# Patient Record
Sex: Male | Born: 1965 | Race: Black or African American | Hispanic: No | Marital: Married | Smoking: Never smoker
Health system: Southern US, Community
[De-identification: ages and names within clinical notes are randomized; demographics above are authoritative.]

## PROBLEM LIST (undated history)

## (undated) DIAGNOSIS — I8500 Esophageal varices without bleeding: Secondary | ICD-10-CM

## (undated) DIAGNOSIS — K7460 Unspecified cirrhosis of liver: Secondary | ICD-10-CM

## (undated) DIAGNOSIS — K759 Inflammatory liver disease, unspecified: Secondary | ICD-10-CM

## (undated) DIAGNOSIS — K766 Portal hypertension: Secondary | ICD-10-CM

## (undated) HISTORY — DX: Inflammatory liver disease, unspecified: K75.9

## (undated) HISTORY — PX: HERNIA REPAIR: SHX51

## (undated) HISTORY — PX: TIPS PROCEDURE: SHX808

## (undated) HISTORY — DX: Portal hypertension: K76.6

## (undated) HISTORY — DX: Unspecified cirrhosis of liver: K74.60

## (undated) HISTORY — DX: Esophageal varices without bleeding: I85.00

---

## 2008-06-09 ENCOUNTER — Ambulatory Visit: Payer: Self-pay | Admitting: Gastroenterology

## 2008-06-09 DIAGNOSIS — B181 Chronic viral hepatitis B without delta-agent: Secondary | ICD-10-CM | POA: Insufficient documentation

## 2008-06-09 DIAGNOSIS — K7460 Unspecified cirrhosis of liver: Secondary | ICD-10-CM | POA: Insufficient documentation

## 2008-06-09 LAB — CONVERTED CEMR LAB
ALT: 42 units/L (ref 0–53)
Albumin: 3.6 g/dL (ref 3.5–5.2)
Alkaline Phosphatase: 78 units/L (ref 39–117)
CO2: 28 meq/L (ref 19–32)
Calcium: 9.2 mg/dL (ref 8.4–10.5)
Creatinine, Ser: 1.1 mg/dL (ref 0.4–1.5)
GFR calc Af Amer: 95 mL/min
Glucose, Bld: 138 mg/dL — ABNORMAL HIGH (ref 70–99)
HCT: 40.9 % (ref 39.0–52.0)
Hemoglobin: 13.3 g/dL (ref 13.0–17.0)
Lymphocytes Relative: 17.9 % (ref 12.0–46.0)
Neutrophils Relative %: 66.5 % (ref 43.0–77.0)
Platelets: 54 10*3/uL — ABNORMAL LOW (ref 150–400)
Prothrombin Time: 15.4 s — ABNORMAL HIGH (ref 10.9–13.3)
RBC: 5.52 M/uL (ref 4.22–5.81)
RDW: 15.9 % — ABNORMAL HIGH (ref 11.5–14.6)
WBC: 2 10*3/uL — ABNORMAL LOW (ref 4.5–10.5)
aPTT: 35.8 s — ABNORMAL HIGH (ref 21.7–29.8)

## 2008-06-15 ENCOUNTER — Ambulatory Visit: Payer: Self-pay | Admitting: Gastroenterology

## 2008-06-15 ENCOUNTER — Ambulatory Visit (HOSPITAL_COMMUNITY): Admission: RE | Admit: 2008-06-15 | Discharge: 2008-06-15 | Payer: Self-pay | Admitting: Gastroenterology

## 2008-06-16 ENCOUNTER — Ambulatory Visit (HOSPITAL_COMMUNITY): Admission: RE | Admit: 2008-06-16 | Discharge: 2008-06-16 | Payer: Self-pay | Admitting: Gastroenterology

## 2009-04-11 ENCOUNTER — Encounter (INDEPENDENT_AMBULATORY_CARE_PROVIDER_SITE_OTHER): Payer: Self-pay | Admitting: *Deleted

## 2009-05-08 ENCOUNTER — Encounter (INDEPENDENT_AMBULATORY_CARE_PROVIDER_SITE_OTHER): Payer: Self-pay | Admitting: *Deleted

## 2010-03-07 ENCOUNTER — Telehealth: Payer: Self-pay | Admitting: Gastroenterology

## 2010-07-07 ENCOUNTER — Encounter: Payer: Self-pay | Admitting: Gastroenterology

## 2010-07-09 ENCOUNTER — Encounter (INDEPENDENT_AMBULATORY_CARE_PROVIDER_SITE_OTHER): Payer: Self-pay | Admitting: *Deleted

## 2010-07-09 ENCOUNTER — Telehealth: Payer: Self-pay | Admitting: Gastroenterology

## 2010-07-16 ENCOUNTER — Encounter: Payer: Self-pay | Admitting: Gastroenterology

## 2010-07-31 ENCOUNTER — Encounter: Payer: Self-pay | Admitting: Gastroenterology

## 2010-08-13 ENCOUNTER — Ambulatory Visit: Payer: Self-pay | Admitting: Gastroenterology

## 2010-08-14 ENCOUNTER — Encounter: Payer: Self-pay | Admitting: Gastroenterology

## 2010-08-14 ENCOUNTER — Telehealth (INDEPENDENT_AMBULATORY_CARE_PROVIDER_SITE_OTHER): Payer: Self-pay | Admitting: *Deleted

## 2010-08-14 LAB — CONVERTED CEMR LAB
ALT: 26 units/L (ref 0–53)
AST: 44 units/L — ABNORMAL HIGH (ref 0–37)
Albumin: 3.7 g/dL (ref 3.5–5.2)
BUN: 15 mg/dL (ref 6–23)
Basophils Absolute: 0 10*3/uL (ref 0.0–0.1)
Basophils Relative: 0.1 % (ref 0.0–3.0)
Calcium: 9.1 mg/dL (ref 8.4–10.5)
Chloride: 106 meq/L (ref 96–112)
GFR calc non Af Amer: 83.89 mL/min (ref >60.00)
Hemoglobin: 12.1 g/dL — ABNORMAL LOW (ref 13.0–17.0)
INR: 1.4 — ABNORMAL HIGH (ref 0.8–1.0)
Lymphs Abs: 0.4 10*3/uL — ABNORMAL LOW (ref 0.7–4.0)
MCHC: 33.2 g/dL (ref 30.0–36.0)
Monocytes Relative: 11.4 % (ref 3.0–12.0)
Neutrophils Relative %: 66.8 % (ref 43.0–77.0)
Prothrombin Time: 14.7 s — ABNORMAL HIGH (ref 9.7–11.8)
RDW: 16.6 % — ABNORMAL HIGH (ref 11.5–14.6)

## 2010-08-26 ENCOUNTER — Encounter: Payer: Self-pay | Admitting: Gastroenterology

## 2010-08-28 ENCOUNTER — Encounter
Admission: RE | Admit: 2010-08-28 | Discharge: 2010-08-28 | Payer: Self-pay | Source: Home / Self Care | Attending: Gastroenterology | Admitting: Gastroenterology

## 2010-08-28 ENCOUNTER — Encounter: Payer: Self-pay | Admitting: Gastroenterology

## 2010-08-28 ENCOUNTER — Ambulatory Visit: Admit: 2010-08-28 | Payer: Self-pay | Admitting: Gastroenterology

## 2010-08-29 ENCOUNTER — Telehealth: Payer: Self-pay | Admitting: Gastroenterology

## 2010-08-29 ENCOUNTER — Encounter: Payer: Self-pay | Admitting: Gastroenterology

## 2010-08-30 ENCOUNTER — Encounter: Payer: Self-pay | Admitting: Gastroenterology

## 2010-08-30 ENCOUNTER — Encounter
Admission: RE | Admit: 2010-08-30 | Discharge: 2010-08-30 | Payer: Self-pay | Source: Home / Self Care | Attending: Gastroenterology | Admitting: Gastroenterology

## 2010-08-30 DIAGNOSIS — K766 Portal hypertension: Secondary | ICD-10-CM | POA: Insufficient documentation

## 2010-09-12 ENCOUNTER — Ambulatory Visit (HOSPITAL_COMMUNITY)
Admission: RE | Admit: 2010-09-12 | Discharge: 2010-09-13 | Payer: Self-pay | Source: Home / Self Care | Attending: Interventional Radiology | Admitting: Interventional Radiology

## 2010-09-16 ENCOUNTER — Telehealth: Payer: Self-pay | Admitting: Gastroenterology

## 2010-09-16 LAB — CBC
Hemoglobin: 12.4 g/dL — ABNORMAL LOW (ref 13.0–17.0)
MCH: 23.9 pg — ABNORMAL LOW (ref 26.0–34.0)
MCV: 74 fL — ABNORMAL LOW (ref 78.0–100.0)
MCV: 72.3 fL — ABNORMAL LOW (ref 78.0–100.0)
Platelets: 61 10*3/uL — ABNORMAL LOW (ref 150–400)
RDW: 15.1 % (ref 11.5–15.5)
WBC: 6.3 10*3/uL (ref 4.0–10.5)

## 2010-09-16 LAB — AMMONIA: Ammonia: 66 umol/L — ABNORMAL HIGH (ref 11–35)

## 2010-09-16 LAB — TYPE AND SCREEN: Antibody Screen: NEGATIVE

## 2010-09-16 LAB — COMPREHENSIVE METABOLIC PANEL
AST: 333 U/L — ABNORMAL HIGH (ref 0–37)
Albumin: 3.3 g/dL — ABNORMAL LOW (ref 3.5–5.2)
Alkaline Phosphatase: 61 U/L (ref 39–117)
Alkaline Phosphatase: 55 U/L (ref 39–117)
BUN: 6 mg/dL (ref 6–23)
CO2: 23 mEq/L (ref 19–32)
Calcium: 8.9 mg/dL (ref 8.4–10.5)
Chloride: 102 mEq/L (ref 96–112)
Creatinine, Ser: 1.2 mg/dL (ref 0.4–1.5)
Creatinine, Ser: 1.09 mg/dL (ref 0.4–1.5)
GFR calc Af Amer: >60 mL/min (ref >60)
GFR calc non Af Amer: >60 mL/min (ref >60)
Glucose, Bld: 91 mg/dL (ref 70–99)
Glucose, Bld: 138 mg/dL — ABNORMAL HIGH (ref 70–99)
Potassium: 4.2 mEq/L (ref 3.5–5.1)
Potassium: 3.5 mEq/L (ref 3.5–5.1)
Total Protein: 6.8 g/dL (ref 6.0–8.3)
Total Protein: 6.2 g/dL (ref 6.0–8.3)

## 2010-09-16 LAB — GLUCOSE, CAPILLARY: Glucose-Capillary: 114 mg/dL — ABNORMAL HIGH (ref 70–99)

## 2010-09-16 LAB — PREPARE PLATELETS: Unit division: 0

## 2010-09-16 LAB — SURGICAL PCR SCREEN: MRSA, PCR: NEGATIVE

## 2010-09-16 LAB — PROTIME-INR: Prothrombin Time: 17.7 seconds — ABNORMAL HIGH (ref 11.6–15.2)

## 2010-09-19 ENCOUNTER — Encounter: Payer: Self-pay | Admitting: Gastroenterology

## 2010-09-20 ENCOUNTER — Ambulatory Visit
Admission: RE | Admit: 2010-09-20 | Discharge: 2010-09-20 | Payer: Self-pay | Source: Home / Self Care | Attending: Gastroenterology | Admitting: Gastroenterology

## 2010-09-20 ENCOUNTER — Other Ambulatory Visit: Payer: Self-pay | Admitting: Gastroenterology

## 2010-09-20 LAB — COMPREHENSIVE METABOLIC PANEL
Albumin: 3.4 g/dL — ABNORMAL LOW (ref 3.5–5.2)
Alkaline Phosphatase: 76 U/L (ref 39–117)
CO2: 28 mEq/L (ref 19–32)
Chloride: 106 mEq/L (ref 96–112)
Creatinine, Ser: 0.8 mg/dL (ref 0.4–1.5)
GFR: 133.25 mL/min (ref >60.00)
Total Protein: 6.4 g/dL (ref 6.0–8.3)

## 2010-09-20 LAB — CBC WITH DIFFERENTIAL/PLATELET
Basophils Absolute: 0 10*3/uL (ref 0.0–0.1)
Eosinophils Absolute: 0.3 10*3/uL (ref 0.0–0.7)
Eosinophils Relative: 8.7 % — ABNORMAL HIGH (ref 0.0–5.0)
Monocytes Absolute: 0.4 10*3/uL (ref 0.1–1.0)
Neutrophils Relative %: 61.6 % (ref 43.0–77.0)
RDW: 16.1 % — ABNORMAL HIGH (ref 11.5–14.6)
WBC: 3.2 10*3/uL — ABNORMAL LOW (ref 4.5–10.5)

## 2010-09-24 ENCOUNTER — Ambulatory Visit
Admission: RE | Admit: 2010-09-24 | Discharge: 2010-09-24 | Payer: Self-pay | Source: Home / Self Care | Attending: Gastroenterology | Admitting: Gastroenterology

## 2010-09-24 ENCOUNTER — Encounter
Admission: RE | Admit: 2010-09-24 | Discharge: 2010-09-24 | Payer: Self-pay | Source: Home / Self Care | Attending: Interventional Radiology | Admitting: Interventional Radiology

## 2010-09-24 NOTE — Letter (Signed)
Summary: New Patient letter  [facility]  [address] [place], [place] [address]   Phone: [phone]  Fax: [phone]       07/09/2010 MRN: [id number]  Gordon Huerta [facility] [address] [place], [place]  [address]  Dear Mr. Gordon Huerta,  Welcome to the Gastroenterology Division at [facility].    You are scheduled to see Dr. Christella Hartigan  on 08/13/10  at 2:45 pm on the 3rd floor at [facility], [address]  We ask that you try to arrive at our office 15 minutes prior to your appointment time to allow for check-in.  We would like you to complete the enclosed self-administered evaluation form prior to your visit and bring it with you on the day of your appointment.  We will review it with you.  Also, please bring a complete list of all your medications or, if you prefer, bring the medication bottles and we will list them.  Please bring your insurance card so that we may make a copy of it.  If your insurance requires a referral to see a specialist, please bring your referral form from your primary care physician.  Co-payments are due at the time of your visit and may be paid by cash, check or credit card.     Your office visit will consist of a consult with your physician (includes a physical exam), any laboratory testing he/she may order, scheduling of any necessary diagnostic testing (e.g. x-ray, ultrasound, CT-scan), and scheduling of a procedure (e.g. Endoscopy, Colonoscopy) if required.  Please allow enough time on your schedule to allow for any/all of these possibilities.    If you cannot keep your appointment, please call [phone] to cancel or reschedule prior to your appointment date.  This allows [place] the opportunity to schedule an appointment for another patient in need of care.  If you do not cancel or reschedule by 5 p.m. the business day prior to your appointment date, you will be charged a $50.00 late cancellation/no-show fee.    Thank you for  choosing [facility] for your medical needs.  We appreciate the opportunity to care for you.  Please visit [place] at our website  to learn more about our practice.                     Sincerely,                                                             The Gastroenterology Division

## 2010-09-24 NOTE — Progress Notes (Signed)
Summary: update  Phone Note Call from Patient Call back at Home Phone [phone]   Caller: mother-in-law, Bonita Quin Call For: Dr. Christella Hartigan Reason for Call: Talk to Nurse Summary of Call: would like to update Dr. Christella Hartigan on pt's current "condition" overseas Initial call taken by: Vallarie Mare,  July 09, 2010 2:17 PM  Follow-up for Phone Call        Pt mother n law spoke with the pt and  he wanted Dr Christella Hartigan to know  he had an esophageal bleed on 07/05/10.  He was sent to his local hospital and had EGD and COLON in [place].  Had three bandings.  The Dr told him he had several places that looked like it could begin to bleed.  The pt is staying in [place] until the pt has improved and then to [place] in December. Wants an appt.  when he returns.  Appt given for 08/13/10  Follow-up by: Chales Abrahams CMA Duncan Dull),  July 09, 2010 3:32 PM

## 2010-09-24 NOTE — Progress Notes (Signed)
Summary: Schedule REV  ---- Converted from flag ---- ---- 03/07/2010 11:20 AM, Rachael Fee MD wrote: I don't know.  She should probably have blood tests for Hep B surface antibody, if + then I don't think she needs any boosters.  ---- 03/07/2010 11:16 AM, Harlow Mares CMA (AAMA) wrote: thanks you the wife has a question that she is hoping you can help her with' she has had the Hep B vaccine and want to know if she would need a booster since her husband has Hep B. They will be back in the [place] next June and she has made a note to call us back about making an appt to see you.   ---- 03/07/2010 10:52 AM, Rachael Fee MD wrote: I think it is a bit late to see him tomorrow...any tests, procedures that he needs done will have to wait since he will be gone on Monday.  He needs to get back in to see me when he is back to [place], preferably a few weeks (at least) before he leaves again for [place]  ---- 03/07/2010 10:45 AM, Harlow Mares CMA (AAMA) wrote: hey patient lives out of the country and is leaving monday to go back to [place] and will not be back until next year. I called him as part of the recall project to see if he could schedule an office visit and he states that he can only come for an office visit today or tomorrow would be best if at all possible. You are in the office tomorrow afternoon, but your full. would you like to add him anyway?   Hulan Saas ------------------------------  Phone Note Outgoing Call   Call placed by: Harlow Mares CMA Duncan Dull),  March 07, 2010 11:24 AM Call placed to: Patient Summary of Call: Called and spoke to the patients wife Jae Dire and she states that they will call back to schedule and office visit when they come back in the [place] next June or sooner if they come back sooner. She will make sure he schedules soon enough to get all of his test done if needed. She thanked Dr. Christella Hartigan for all of his help.  Initial call taken by: Harlow Mares CMA (AAMA),  March 07, 2010  11:26 AM

## 2010-09-26 NOTE — Assessment & Plan Note (Signed)
Review of gastrointestinal problems: 1. 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 antibody was positive E. antigen was negative hepatitis B surface antibody negative hepatitis B surface antigen was positive)  Last imaging 2009 ultrasound suggested cirrhosis without discrete lesions Last alpha-fetoprotein was normal 2009 History of variceal bleeding, 2011 in [place]. Also bled in 2008 in [place] EGD by Dr. Christella Hartigan 2009, portal gastropathy and esophageal varices noted, these were banded. 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  Had EGD (varices in esophagus and in stomach) banding, and glue to gastric varices.  he had a repeat endoscopy about a week later and it showed persistent gastric varices. He brought in a picture that was in color and clearly he does have at least one or 2 trunks of proximal gastric varices.   He is not getting any Hep B treatment, probably aquired in youth.               Current Medications (verified): 1)  Propranolol Hcl 10 Mg Tabs (Propranolol Hcl) .Marland Kitchen.. 1 By Mouth Once Daily 2)  Aldactone 25 Mg Tabs (Spironolactone) .Marland Kitchen.. 1 By Mouth Once Daily 3)  Nexium 20 Mg Cpdr (Esomeprazole Magnesium) .Marland Kitchen.. 1 By Mouth Once Daily  Allergies (verified): No Known Drug Allergies  Vital Signs:  Patient profile:   45 year old male Height:      75 inches Weight:      255 pounds BMI:     31.99 BSA:     2.44 Pulse rate:   60 / minute BP sitting:   110 / 80  (left arm)  Vitals Entered By: Merri Ray CMA Duncan Dull) (August 13, 2010 2:38 PM)  Physical Exam  Additional  Exam:  Constitutional: generally well appearing Psychiatric: alert and oriented times 3 Abdomen: soft, non-tender, non-distended, normal bowel sounds    Impression & Recommendations:  Problem # 1:  cirrhosis, known gastric and esophageal varices visit difficult situation because 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  He has esophageal and gastric varices. This is despite propranolol at dose that is high enough to cause him to have heart rate 60. Don't think there is room to go up on his beta blocker. I explained to him that given his young age, he will very likely need a liver transplant at some point in the next several years.  One of these days he is going to have a major variceal bleed and may not recover from that. I think TIPS may be his best option and I discussed this with interventional radiologist. They agree and are willing to meet him in consultation to consider TIPS.  I explained to he and his wife that over the long haul, he needs to consider relocating an area where he has access to liver transplantation.  Other Orders: T-Hepatitis B Surface Antigen [phone]) T-Hepatitis B Surface Antibody ([id number]) TLB-CMP (Comprehensive Metabolic Pnl) (80053-COMP) TLB-PT (Protime) (85610-PTP) TLB-CBC Platelet - w/Differential (85025-CBCD)  Patient Instructions: 1)  You will get lab test(s) done today (INR, CBC,  CMET, Hep B surface antigen, Hep B surface antibody). 2)  We will set you up to meet Dr. Maryclare Bean, interventional radiologist, to consider TIPS. 3)  The medication list was reviewed and reconciled.  All changed / newly prescribed medications were explained.  A complete medication list was provided to the patient / caregiver.  Appended Document: Orders Update/IR    Clinical Lists Changes  Orders: Added new Test order of TIPS (TIPS) - Signed

## 2010-09-26 NOTE — Procedures (Signed)
Summary: ENDO/[facility]  ENDO/[facility]   Imported By: Lester [place] 08/20/2010 11:35:50  _____________________________________________________________________  External Attachment:    Type:   Image     Comment:   External Document

## 2010-09-26 NOTE — Letter (Signed)
Summary: Results Letter  [facility]  [address] [place], [place] [address]   Phone: [phone]  Fax: [phone]        August 26, 2010 MRN: [id number]    [facility] [address] [place], [place]  [address]    Dear Mr. PERRAULT,   I tried contacting you by phone, but I think I have a wrong number listed as your contact information.  The Hepatitis B bloodwork is encouraging, it looks like you probably do NOT have any circulating virus (the Hepatitis B surface antigen was negative and Hepatitis B surface antibody was "indeterminant).  I see you are scheduled to meet with Dr. Bonnielee Haff in a couple days, I hope to hear from you if any problems arise.     Sincerely,  Rachael Fee MD  This letter has been electronically signed by your physician.  Appended Document: Results Letter letter mailed and pt aware

## 2010-09-26 NOTE — Progress Notes (Signed)
Summary: Triage  Phone Note Call from Patient Call back at Home Phone [phone]   Caller: Patient Call For: Dr. Christella Hartigan Reason for Call: Talk to Nurse Summary of Call: Calling about test results and needs a refill on his Aldactone Initial call taken by: Karna Christmas,  August 29, 2010 10:42 AM  Follow-up for Phone Call        pt given refill and labs mailed to the home per pt request Follow-up by: Chales Abrahams CMA Duncan Dull),  August 29, 2010 10:48 AM    Prescriptions: ALDACTONE 25 MG TABS (SPIRONOLACTONE) 1 by mouth once daily  #30 x 3   Entered by:   Chales Abrahams CMA (AAMA)   Authorized by:   Rachael Fee MD   Signed by:   Chales Abrahams CMA (AAMA) on 08/29/2010   Method used:   Electronically to        [facility]  [id number] (ret[facility]       [address]       [place], [place]  [address]       Ph: [phone] or [phone]       Fax: [phone]   RxID:   [id number]

## 2010-09-26 NOTE — Miscellaneous (Signed)
Summary: Orders Update/US LIVER  Clinical Lists Changes  Problems: Added new problem of PORTAL HYPERTENSION (ICD-572.3) Orders: Added new Test order of Ultrasound Abdomen (UAS) - Signed

## 2010-09-26 NOTE — Consult Note (Signed)
Summary: [facility]   Imported By: Sherian Rein 09/11/2010 11:45:17  _____________________________________________________________________  External Attachment:    Type:   Image     Comment:   External Document

## 2010-09-26 NOTE — Progress Notes (Signed)
Summary: Appt asap  Phone Note Call from Patient Call back at Home Phone [phone] Call back at [phone] try this one first   Call For: Dr Christella Hartigan Reason for Call: Talk to Nurse Summary of Call: Wants appt to follow up on procedure he had. Needs appt asap Is planning on going back to [place] and wants to see Dr first Initial call taken by: Leanor Kail [facility]),  September 16, 2010 10:37 AM  Follow-up for Phone Call        Dr Christella Hartigan the pt says he was told by Dr Bonnielee Haff he needed to see you for an amonia level and appt.  The pt is going back to [place] next Thursday and you have no openings.  Do you want me to double book? Follow-up by: Chales Abrahams CMA Duncan Dull),  September 16, 2010 2:20 PM  Additional Follow-up for Phone Call Additional follow up Details #1::        yes, double book. he needs cmet, cbc, ammonia level checked the day prior Additional Follow-up by: Rachael Fee MD,  September 17, 2010 7:20 AM    Additional Follow-up for Phone Call Additional follow up Details #2::    pt aware of ROV and labs Follow-up by: Chales Abrahams CMA Duncan Dull),  September 17, 2010 1:46 PM

## 2010-09-26 NOTE — Progress Notes (Signed)
Summary: Dr Bonnielee Haff consult  Phone Note Outgoing Call Call back at [facility] Phone [phone]   Call placed by: Chales Abrahams CMA Duncan Dull),  August 14, 2010 9:46 AM Summary of Call: left message on machine to call back regarding consult with Dr [facility] 08/28/10 [address]  9 am arrival Initial call taken by: Chales Abrahams CMA Duncan Dull),  August 14, 2010 9:46 AM  Follow-up for Phone Call        pt aware of all the info for his appt Follow-up by: Chales Abrahams CMA Duncan Dull),  August 14, 2010 10:00 AM

## 2010-10-02 NOTE — Assessment & Plan Note (Signed)
  Review of gastrointestinal problems: 1. 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 antibody was positive E. antigen was negative hepatitis B surface antibody negative hepatitis B surface antigen was positive)  Last imaging 2009 ultrasound suggested cirrhosis without discrete lesions Last alpha-fetoprotein was normal 2009 History of variceal bleeding, 2011 in [place]. Also bled in 2008 in [place] EGD by Dr. Christella Hartigan 2009, portal gastropathy and esophageal varices noted, these were banded. EGD 2011 in [place], found esophageal and gastric varices TIPS placed in [facility] Jan, 2012.   History of Present Illness Visit Type: Follow-up Visit Primary GI MD: Rob Bunting MD Primary Provider: Dr Teresa Coombs in [place] Requesting Provider: n/a Chief Complaint: Liver History of Present Illness:     had TIPS place earlier this month.  had 3-4 days of pains, needed. He has no mental status changes. Sleeping well.  NO comments from family about acting oddly.  Blood work last week showed an ammonia level of 70, transaminases slightly elevated, bilirubin 2.0. He has been taking lactulose 2 doses a day and is having 4-5 loose stools everyday           Current Medications (verified): 1)  Propranolol Hcl 10 Mg Tabs (Propranolol Hcl) .Marland Kitchen.. 1 By Mouth Once Daily 2)  Aldactone 25 Mg Tabs (Spironolactone) .Marland Kitchen.. 1 By Mouth Once Daily 3)  Lactulose 10 Gm/67ml Soln (Lactulose) .... Take 30 Ml 1-2 Times Daily  Allergies (verified): No Known Drug Allergies  Vital Signs:  Patient profile:   46 year old male Height:      75 inches Weight:      250 pounds BMI:     31.36 Pulse rate:   60 / minute Pulse rhythm:   regular BP sitting:   132 / 80  (left arm) Cuff size:   regular  Vitals Entered By: June McMurray CMA Duncan Dull) (September 24, 2010 8:40 AM)  Physical Exam  Additional Exam:  Constitutional: generally well  appearing Psychiatric: alert and oriented times 3 Abdomen: soft, non-tender, non-distended, normal bowel sounds    Impression & Recommendations:  Problem # 1:  hepatitis B, cirrhosis, portal hypertension he has no clinical signs of encephalopathy. His ammonia level is slightly elevated but that doesn't mean too much given his normal mental status. He is having more than enough loose stools a day with the lactulose and back she recommended he cut back a bit. His liver tests were slightly elevated but he feels really fine. I recommended he stop the propranolol, stop the Aldactone. He was only taking 25 mg of Aldactone a day and this is probably not making any impact on his fluid status which is normal to my exam today.  He is heading back to [place] in 2 days and knows that he has troubles is to go to a [employer]. He will probably get back in the summer he will call my office for a return visit around then  Patient Instructions: 1)  Decrease lactulose to once daily only. 2)  Can stop aldactone. 3)  Can stop the propranolol. 4)  A copy of this information will be sent to Dr. Bonnielee Haff. 5)  Get to a good medical facility if you have recurrent bleeding. 6)  The medication list was reviewed and reconciled.  All changed / newly prescribed medications were explained.  A complete medication list was provided to the patient / caregiver.

## 2010-12-09 ENCOUNTER — Other Ambulatory Visit: Payer: Self-pay | Admitting: Interventional Radiology

## 2010-12-09 DIAGNOSIS — Z95828 Presence of other vascular implants and grafts: Secondary | ICD-10-CM

## 2011-01-24 ENCOUNTER — Other Ambulatory Visit: Payer: Self-pay | Admitting: Gastroenterology

## 2011-01-24 DIAGNOSIS — Z95828 Presence of other vascular implants and grafts: Secondary | ICD-10-CM

## 2011-01-24 DIAGNOSIS — K7460 Unspecified cirrhosis of liver: Secondary | ICD-10-CM

## 2011-02-03 ENCOUNTER — Telehealth: Payer: Self-pay | Admitting: Gastroenterology

## 2011-02-03 NOTE — Telephone Encounter (Signed)
Pt has appt with Dr Bonnielee Haff at 1030 and appt with Dr Christella Hartigan at 930 I put a note in the chart to get pt back as soon as possible

## 2011-02-04 ENCOUNTER — Ambulatory Visit (INDEPENDENT_AMBULATORY_CARE_PROVIDER_SITE_OTHER): Payer: PRIVATE HEALTH INSURANCE | Admitting: Gastroenterology

## 2011-02-04 ENCOUNTER — Other Ambulatory Visit (INDEPENDENT_AMBULATORY_CARE_PROVIDER_SITE_OTHER): Payer: PRIVATE HEALTH INSURANCE

## 2011-02-04 ENCOUNTER — Encounter: Payer: Self-pay | Admitting: Gastroenterology

## 2011-02-04 ENCOUNTER — Other Ambulatory Visit: Payer: Self-pay | Admitting: Gastroenterology

## 2011-02-04 ENCOUNTER — Ambulatory Visit
Admission: RE | Admit: 2011-02-04 | Discharge: 2011-02-04 | Disposition: A | Discharge status: Home / Self Care | Payer: PRIVATE HEALTH INSURANCE | Source: Ambulatory Visit | Attending: Interventional Radiology | Admitting: Interventional Radiology

## 2011-02-04 ENCOUNTER — Ambulatory Visit
Admission: RE | Admit: 2011-02-04 | Discharge: 2011-02-04 | Disposition: A | Discharge status: Home / Self Care | Payer: PRIVATE HEALTH INSURANCE | Source: Ambulatory Visit | Attending: Gastroenterology | Admitting: Gastroenterology

## 2011-02-04 ENCOUNTER — Other Ambulatory Visit (HOSPITAL_COMMUNITY): Payer: Self-pay | Admitting: Interventional Radiology

## 2011-02-04 VITALS — BP 142/76 | HR 72 | Ht 75.0 in | Wt 256.2 lb

## 2011-02-04 DIAGNOSIS — K7460 Unspecified cirrhosis of liver: Secondary | ICD-10-CM

## 2011-02-04 DIAGNOSIS — Z95828 Presence of other vascular implants and grafts: Secondary | ICD-10-CM

## 2011-02-04 LAB — CBC WITH DIFFERENTIAL/PLATELET
Basophils Relative: 0 % (ref 0.0–3.0)
Eosinophils Absolute: 0 10*3/uL (ref 0.0–0.7)
Eosinophils Relative: 1.3 % (ref 0.0–5.0)
HCT: 37.2 % — ABNORMAL LOW (ref 39.0–52.0)
Hemoglobin: 12 g/dL — ABNORMAL LOW (ref 13.0–17.0)
Lymphs Abs: 0.3 10*3/uL — ABNORMAL LOW (ref 0.7–4.0)
MCHC: 32.4 g/dL (ref 30.0–36.0)
MCV: 75.1 fl — ABNORMAL LOW (ref 78.0–100.0)
Monocytes Absolute: 0.5 10*3/uL (ref 0.1–1.0)
Neutro Abs: 2.4 10*3/uL (ref 1.4–7.7)
Neutrophils Relative %: 73.8 % (ref 43.0–77.0)
RBC: 4.95 Mil/uL (ref 4.22–5.81)
WBC: 3.3 10*3/uL — ABNORMAL LOW (ref 4.5–10.5)

## 2011-02-04 LAB — COMPREHENSIVE METABOLIC PANEL
ALT: 38 U/L (ref 0–53)
AST: 66 U/L — ABNORMAL HIGH (ref 0–37)
CO2: 26 mEq/L (ref 19–32)
Creatinine, Ser: 1.2 mg/dL (ref 0.4–1.5)
Sodium: 136 mEq/L (ref 135–145)
Total Bilirubin: 2.7 mg/dL — ABNORMAL HIGH (ref 0.3–1.2)
Total Protein: 6.5 g/dL (ref 6.0–8.3)

## 2011-02-04 LAB — AMMONIA: Ammonia: 66 umol/L — ABNORMAL HIGH (ref 11–35)

## 2011-02-04 NOTE — Progress Notes (Signed)
Review of gastrointestinal problems:  1. 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 antibody was positive E. antigen was negative hepatitis B surface antibody negative hepatitis B surface antigen was positive)   Last imaging 2009 ultrasound suggested cirrhosis without discrete lesions   Last alpha-fetoprotein was normal 2009   History of variceal bleeding, 2011 in [place]. Also bled in 2008 in [place]   EGD by Dr. Christella Hartigan 2009, portal gastropathy and esophageal varices noted, these were banded.   EGD 2011 in [place], found esophageal and gastric varices   TIPS placed in [facility] Jan, 2012.   HPI: This is a very pleasant 45 year old man who is here with his wife today. I last saw him about 5 months ago prior to him going back to [place], shortly after he underwent TIPS procedure.  Back from [place] for 3-4 days, nausea, fevers shortly after getting back.  Fever was 103.  Had diarrhea and headache.  No sick contact.  Diarrhea was non-bloody.  3-4 times per day, including at night.  No anitibiotics recently.  Heading back to [place] in 2-3 weeks.  Having TIPS check later this morning.  Excercises.  Other than this acute illness he has felt very well overall. No real trouble with swelling, no overt GI bleeding, he takes lactulose twice a day and has had no signs of encephalopathy.    Past Medical History:   Cirrhosis                                                    Hepatitis B                                            Esophageal varices                                           Portal hypertension                                        Past Surgical History:   HERNIA REPAIR                                                TIPS PROCEDURE 2012                                             reports that he has never smoked. He has never used smokeless tobacco. He reports that he does not drink alcohol. His drug history  not on file. family history includes Heart attack in his father and Liver cancer in his mother.    Current medicines and allergies were reviewed in [facility]    Physical Exam: BP 142/76  Pulse 72  Ht  6\' 3"  (1.905 m)  Wt 256 lb 3.2 oz (116.212 kg)  BMI 32.02 kg/m2 Constitutional: generally well-appearing Psychiatric: alert and oriented x3 Abdomen: soft, nontender, nondistended, no obvious ascites, no peritoneal signs, normal bowel sounds     Assessment and plan: 45 y.o. male with cirrhosis  Clinically he is doing very well. He is going to have TIPS check later today. He will get basic set of blood work including CBC, complete metabolic profile, coags, alpha-fetoprotein. He is planning to head back to [place] in 3 or 4 weeks and knows to get back in touch with me when he returns to the [place].

## 2011-02-04 NOTE — Patient Instructions (Addendum)
You will have labs checked today in the basement lab.  Please head down after you check out with the front desk  (cmet, inr, cbc, AFP). TIPS check later today. It is important that you have a relatively low salt diet.  High salt diet can cause fluid to accumulate in your legs, abdomen and even around your lungs. You should try to avoid NSAID type over the counter pain medicines as best as possible. Tylenol is safe to take for 'routine' aches and pains, but never take more than 1/2 the dose suggested on the package instructions (never more than 2 grams per day). Avoid alcohol. Call for appt with Dr. Christella Hartigan when you return to [place]. A copy of this information will be made available to Dr. Bonnielee Haff (interventional radiology).

## 2011-02-05 ENCOUNTER — Ambulatory Visit (HOSPITAL_COMMUNITY)
Admission: RE | Admit: 2011-02-05 | Discharge: 2011-02-05 | Disposition: A | Discharge status: Home / Self Care | Payer: 59 | Source: Ambulatory Visit | Attending: Interventional Radiology | Admitting: Interventional Radiology

## 2011-02-05 ENCOUNTER — Telehealth: Payer: Self-pay | Admitting: *Deleted

## 2011-02-05 DIAGNOSIS — Z01812 Encounter for preprocedural laboratory examination: Secondary | ICD-10-CM | POA: Insufficient documentation

## 2011-02-05 DIAGNOSIS — Y849 Medical procedure, unspecified as the cause of abnormal reaction of the patient, or of later complication, without mention of misadventure at the time of the procedure: Secondary | ICD-10-CM | POA: Insufficient documentation

## 2011-02-05 DIAGNOSIS — T85698A Other mechanical complication of other specified internal prosthetic devices, implants and grafts, initial encounter: Secondary | ICD-10-CM | POA: Insufficient documentation

## 2011-02-05 LAB — COMPREHENSIVE METABOLIC PANEL
AST: 59 U/L — ABNORMAL HIGH (ref 0–37)
Albumin: 2.9 g/dL — ABNORMAL LOW (ref 3.5–5.2)
Alkaline Phosphatase: 67 U/L (ref 39–117)
BUN: 14 mg/dL (ref 6–23)
CO2: 24 mEq/L (ref 19–32)
Chloride: 107 mEq/L (ref 96–112)
GFR calc non Af Amer: >60 mL/min (ref >60)
Potassium: 3.5 mEq/L (ref 3.5–5.1)
Total Bilirubin: 2.6 mg/dL — ABNORMAL HIGH (ref 0.3–1.2)

## 2011-02-05 LAB — CBC
HCT: 36.4 % — ABNORMAL LOW (ref 39.0–52.0)
RBC: 5.13 MIL/uL (ref 4.22–5.81)
RDW: 16.7 % — ABNORMAL HIGH (ref 11.5–15.5)
WBC: 2.5 10*3/uL — ABNORMAL LOW (ref 4.0–10.5)

## 2011-02-05 LAB — APTT: aPTT: 43 seconds — ABNORMAL HIGH (ref 24–37)

## 2011-02-05 MED ORDER — IOHEXOL 300 MG/ML  SOLN: 100.0000 mL | Freq: Once | INTRAMUSCULAR | Status: AC | PRN

## 2011-02-05 NOTE — Telephone Encounter (Signed)
Dr Marguerita Merles, Transplant, [facility], [place], [place]    Office Number   [phone] Received referral form to complete and fax back for an appt.

## 2011-02-05 NOTE — Telephone Encounter (Signed)
Message copied by Florene Glen on Wed Feb 05, 2011  2:28 PM ------      Message from: Rob Bunting P      Created: Wed Feb 05, 2011  7:57 AM                   Please call the patient.  The labs show his liver numbers are all stable to a bit worse.  Also platelets  Are lower than usual (27).  I read that his TIPS check didn't really go as planned and they have scheduled him for another TIPS evaluation.

## 2011-02-05 NOTE — Telephone Encounter (Signed)
Message copied by Florene Glen on Wed Feb 05, 2011  2:14 PM ------      Message from: Rob Bunting P      Created: Wed Feb 05, 2011  7:57 AM                   Please call the patient.  The labs show his liver numbers are all stable to a bit worse.  Also platelets  Are lower than usual (27).  I read that his TIPS check didn't really go as planned and they have scheduled him for another TIPS evaluation.

## 2011-02-05 NOTE — Telephone Encounter (Signed)
UNOS MELD score 16 based on labs yesterday.  I spoke with his wife at [phone] (cell).  I discussed case with [facility] liver transplant center:     He needs referral to Dr. Timothy Lasso at [facility] transplant.  Hep B, cirrhosis.  He is not an [place] citizen but is getting permanent visa and then green card within next 12 months or so. He is married to [place] citizen and is on her Oncologist (scholars insurance, affiliated with [employer] now).  Can you call for appt with Dr. Timothy Lasso, get an idea how soon he can be seen there.  He want to head back to [place] (lives there, but is planning to move permanantly in [place] soon) in next 1-2 months.  thanks

## 2011-02-05 NOTE — Telephone Encounter (Signed)
Dr Christella Hartigan, sorry for the vague message! Called pt's home to give results and his father in law answered. He reported pt is having a procedure now by Dr Orest Dikes ?d/t his shunt being almost blocked. Is this the TIPS eval or do I need to call back after his procedure with your message? Thanks.

## 2011-02-05 NOTE — Telephone Encounter (Signed)
Faxed requested info to [facility], fax [phone]

## 2011-02-05 NOTE — Telephone Encounter (Signed)
Spoke with pt's wife who stated pt had the TIPS revision today by Dr Orest Dikes. They need to know what the next step is- labs, OV? Please advise. Thanks.

## 2011-02-06 ENCOUNTER — Telehealth: Payer: Self-pay | Admitting: *Deleted

## 2011-02-06 NOTE — Telephone Encounter (Signed)
Pt would like a refill on Lactulose 10gram/5ml solution per our records. I do not know the dose. Dr Christella Hartigan can we fill this? Thanks.

## 2011-02-10 NOTE — Telephone Encounter (Signed)
Spoke with pt to inform him I have the order now from Dr Christella Hartigan for Lactulose. Pt stated he looked at the old bottle and he had refills. He will try that and call if he has any problems.

## 2011-02-10 NOTE — Telephone Encounter (Signed)
Yes, he should take 30cc twice daily.  Needs one month worth with 11 refills. thanks

## 2011-02-14 ENCOUNTER — Other Ambulatory Visit: Payer: Self-pay

## 2011-02-14 MED ORDER — LACTULOSE 10 GM/15ML PO SOLN: 30.0000 mL | Freq: Two times a day (BID) | ORAL | Status: DC

## 2011-03-04 ENCOUNTER — Other Ambulatory Visit: Payer: Self-pay | Admitting: Interventional Radiology

## 2011-03-04 ENCOUNTER — Other Ambulatory Visit: Payer: Self-pay | Admitting: Gastroenterology

## 2011-03-04 DIAGNOSIS — Z95828 Presence of other vascular implants and grafts: Secondary | ICD-10-CM

## 2011-03-05 ENCOUNTER — Ambulatory Visit
Admission: RE | Admit: 2011-03-05 | Discharge: 2011-03-05 | Disposition: A | Discharge status: Home / Self Care | Payer: PRIVATE HEALTH INSURANCE | Source: Ambulatory Visit | Attending: Gastroenterology | Admitting: Gastroenterology

## 2011-03-05 ENCOUNTER — Ambulatory Visit
Admission: RE | Admit: 2011-03-05 | Discharge: 2011-03-05 | Disposition: A | Discharge status: Home / Self Care | Payer: PRIVATE HEALTH INSURANCE | Source: Ambulatory Visit | Attending: Interventional Radiology | Admitting: Interventional Radiology

## 2011-03-05 ENCOUNTER — Other Ambulatory Visit (HOSPITAL_COMMUNITY): Payer: Self-pay | Admitting: Interventional Radiology

## 2011-03-05 DIAGNOSIS — Z95828 Presence of other vascular implants and grafts: Secondary | ICD-10-CM

## 2011-03-06 ENCOUNTER — Ambulatory Visit (HOSPITAL_COMMUNITY)
Admission: RE | Admit: 2011-03-06 | Discharge: 2011-03-06 | Disposition: A | Discharge status: Home / Self Care | Payer: PRIVATE HEALTH INSURANCE | Source: Ambulatory Visit | Attending: Interventional Radiology | Admitting: Interventional Radiology

## 2011-03-06 DIAGNOSIS — R933 Abnormal findings on diagnostic imaging of other parts of digestive tract: Secondary | ICD-10-CM | POA: Insufficient documentation

## 2011-03-06 DIAGNOSIS — Z95828 Presence of other vascular implants and grafts: Secondary | ICD-10-CM

## 2011-03-06 LAB — CBC
MCH: 22.7 pg — ABNORMAL LOW (ref 26.0–34.0)
MCV: 71.1 fL — ABNORMAL LOW (ref 78.0–100.0)
Platelets: 28 10*3/uL — CL (ref 150–400)
RDW: 17 % — ABNORMAL HIGH (ref 11.5–15.5)
WBC: 2.1 10*3/uL — ABNORMAL LOW (ref 4.0–10.5)

## 2011-03-06 LAB — BASIC METABOLIC PANEL
Calcium: 9.6 mg/dL (ref 8.4–10.5)
Creatinine, Ser: 1.01 mg/dL (ref 0.50–1.35)
GFR calc Af Amer: >60 mL/min (ref >60)

## 2011-03-07 ENCOUNTER — Other Ambulatory Visit (HOSPITAL_COMMUNITY): Payer: Self-pay | Admitting: Interventional Radiology

## 2011-03-07 DIAGNOSIS — Z95828 Presence of other vascular implants and grafts: Secondary | ICD-10-CM

## 2011-03-13 ENCOUNTER — Ambulatory Visit (HOSPITAL_COMMUNITY)
Admission: RE | Admit: 2011-03-13 | Discharge: 2011-03-13 | Disposition: A | Discharge status: Home / Self Care | Payer: PRIVATE HEALTH INSURANCE | Source: Ambulatory Visit | Attending: Interventional Radiology | Admitting: Interventional Radiology

## 2011-03-13 DIAGNOSIS — I871 Compression of vein: Secondary | ICD-10-CM | POA: Insufficient documentation

## 2011-03-13 LAB — ABO/RH: ABO/RH(D): A POS

## 2011-03-17 ENCOUNTER — Ambulatory Visit (HOSPITAL_COMMUNITY)
Admission: RE | Admit: 2011-03-17 | Discharge: 2011-03-17 | Disposition: A | Discharge status: Home / Self Care | Payer: 59 | Source: Ambulatory Visit | Attending: Interventional Radiology | Admitting: Interventional Radiology

## 2011-03-17 DIAGNOSIS — I871 Compression of vein: Secondary | ICD-10-CM | POA: Insufficient documentation

## 2011-03-17 DIAGNOSIS — Y838 Other surgical procedures as the cause of abnormal reaction of the patient, or of later complication, without mention of misadventure at the time of the procedure: Secondary | ICD-10-CM | POA: Insufficient documentation

## 2011-03-17 DIAGNOSIS — Z95828 Presence of other vascular implants and grafts: Secondary | ICD-10-CM

## 2011-03-17 DIAGNOSIS — T82898A Other specified complication of vascular prosthetic devices, implants and grafts, initial encounter: Secondary | ICD-10-CM | POA: Insufficient documentation

## 2011-03-17 LAB — CBC
Platelets: 44 10*3/uL — ABNORMAL LOW (ref 150–400)
RDW: 17.3 % — ABNORMAL HIGH (ref 11.5–15.5)
WBC: 1.9 10*3/uL — ABNORMAL LOW (ref 4.0–10.5)

## 2011-03-17 MED ORDER — IOHEXOL 300 MG/ML  SOLN
100.0000 mL | Freq: Once | INTRAMUSCULAR | Status: AC | PRN
  Administered 2011-03-17: 90 mL via INTRAVENOUS

## 2011-03-18 LAB — PREPARE PLATELET PHERESIS: Unit division: 0

## 2011-03-19 ENCOUNTER — Ambulatory Visit (HOSPITAL_COMMUNITY): Payer: PRIVATE HEALTH INSURANCE

## 2011-03-21 ENCOUNTER — Telehealth: Payer: Self-pay | Admitting: Gastroenterology

## 2011-03-21 NOTE — Telephone Encounter (Signed)
It was his insurance and she needs a precert for the transplant eval  I will forward to [place] to have that completed.  I am also going to call the insurance company back and let her know we are taking care of it.

## 2011-03-21 NOTE — Telephone Encounter (Signed)
Thanks.  Sorry you didn't know about that referral.  I think you were out of the office when we initiated it.

## 2011-03-21 NOTE — Telephone Encounter (Signed)
I advised the representative that the referral does not look like it came from [place],  She is going to investigate and call back if she has further questions.  I will forward to Dr Christella Hartigan and see if he can help with who referred the pt to [facility] for liver transplant.

## 2011-03-21 NOTE — Telephone Encounter (Signed)
i referred him to [place] for liver transplant eval.  What was the caller asking about?

## 2011-05-08 ENCOUNTER — Other Ambulatory Visit: Payer: Self-pay | Admitting: Interventional Radiology

## 2011-05-08 ENCOUNTER — Other Ambulatory Visit: Payer: Self-pay | Admitting: Gastroenterology

## 2011-05-08 DIAGNOSIS — K7460 Unspecified cirrhosis of liver: Secondary | ICD-10-CM

## 2011-05-08 DIAGNOSIS — Z95828 Presence of other vascular implants and grafts: Secondary | ICD-10-CM

## 2011-06-17 ENCOUNTER — Ambulatory Visit
Admission: RE | Admit: 2011-06-17 | Discharge: 2011-06-17 | Disposition: A | Discharge status: Home / Self Care | Payer: PRIVATE HEALTH INSURANCE | Source: Ambulatory Visit | Attending: Gastroenterology | Admitting: Gastroenterology

## 2011-06-17 ENCOUNTER — Ambulatory Visit
Admission: RE | Admit: 2011-06-17 | Discharge: 2011-06-17 | Disposition: A | Discharge status: Home / Self Care | Payer: PRIVATE HEALTH INSURANCE | Source: Ambulatory Visit | Attending: Interventional Radiology | Admitting: Interventional Radiology

## 2011-06-17 VITALS — BP 156/81 | HR 50 | Temp 98.2°F | Resp 14

## 2011-06-17 DIAGNOSIS — K7460 Unspecified cirrhosis of liver: Secondary | ICD-10-CM

## 2011-06-17 DIAGNOSIS — Z95828 Presence of other vascular implants and grafts: Secondary | ICD-10-CM

## 2011-06-17 NOTE — Progress Notes (Signed)
Patient has appointment 10/24-10/27/2012 at [facility], [place], [place] for liver transplant workup.   Pt has moved here.  Will most likely purchase permanent home in the [place] area.  Jae Dire (pt's wife) to finish teaching contract in [place] & then will join pt here.    Patient states that he is doing well.  No problems to report since last appointment.    Next f/u TIPS u/s & follow up appointment w/ Dr Bonnielee Haff to be scheduled for Jan 2013.

## 2011-06-26 NOTE — Discharge Summary (Signed)
  Gordon Huerta, TANNEY NO.:  [id number]  MEDICAL RECORD NO.:  [id number]          PATIENT TYPE:  INP  LOCATION:  2107                         FACILITY:  [facility]  PHYSICIAN:  Art A. Blakeleigh Domek, M.D.      DATE OF BIRTH:  11/27/1966  DATE OF ADMISSION:  09/12/2010 DATE OF DISCHARGE:  09/13/2010                              DISCHARGE SUMMARY   REASON FOR HOSPITALIZATION:  A 45 year old male with history of cirrhosis and variceal bleed.  Also has history of hepatitis B and C. Referred to Dr. Bonnielee Haff for a TIPS procedure which was performed on September 12, 2010.  The patient was observed 24 hours overnight without complication.  Denies nausea, vomiting or diarrhea.  Has urinated without problem.  Has no complaints.  Eating well and sleeping well..  TREATMENT:  Transjugular intrahepatic portosystemic shunt placement September 12, 2010, with Dr. Maryclare Bean.  OBJECTIVE:  Temperature 99 degrees.  Blood pressure 115/74.  Heart rate 61.  Respiratory 16  95% O2 sats on room air.  Urinary output on January 19 was 800 mL; January 20 over 1 L.  Heart:  Regular rate and rhythm without murmur.  Lungs:  Clear to auscultation.  Abdomen:  Soft, positive bowel sounds.  No masses, nontender.  Right neck site nontender.  No bleeding.  No hematoma.  Extremities:  Full range of motion.  Alert and oriented and appropriate.  Lab results:  WBC 6.3; hemoglobin 12.4; hematocrit 37.5; platelets 61; ammonia 66; BUN 6; creatinine 1.09; potassium 3.5.  DISCHARGE MEDICATIONS: 1. Aldactone 25 mg 1 p.o. daily. 2. Multivitamins. 3. Propranolol 10 mg 1 p.o. daily. 4. Lactulose 30 mL b.i.d.  Given a prescription for 30 doses.  CONDITION:  Improved.  INSTRUCTIONS: 1. The patient is to be restful for the next 48 hours. 2. No lifting over 10-15 pounds for 48 hours. 3. Keep the band-aid on neck site for 1 week. 4. Continue home medications.  FOLLOWUP INSTRUCTIONS:  The patient is to have a  repeat ultrasound TIPS protocol approximately 1-2 weeks after discharge.  He was given discharge instructions and number to our outpatient clinic which is [phone].  Tammy the scheduler will call him with appointment date and time within the next 72 hours.  Dr. Bonnielee Haff reviewed note and examined the patient.  He agrees with discharge and the patient's condition.     [place] Cellar Laural Benes.   ______________________________ Art A. Kailo Kosik, M.D.    PAT/MEDQ  D:  09/13/2010  T:  09/14/2010  Job:  [id number]  Electronically Signed by Ralene Muskrat P.A. on 09/23/2010 11:54:45 AM Electronically Signed by Maryclare Bean M.D. on 06/26/2011 02:10:00 PM

## 2011-06-26 NOTE — H&P (Signed)
  NAME:  Gordon Huerta, Gordon Huerta           ACCOUNT NO.:  [id number]  MEDICAL RECORD NO.:  [id number]          PATIENT TYPE:  AMB  LOCATION:  SDS                          FACILITY:  [facility]  PHYSICIAN:  Art A. Waniya Hoglund, M.D.      DATE OF BIRTH:  11/27/1966  DATE OF ADMISSION:  09/12/2010 DATE OF DISCHARGE:                             HISTORY & PHYSICAL   SUBJECTIVE:  This is a 45 year old male with history of cirrhosis and variceal bleed.  At least 3 endoscopic variceal bandings.  The patient also had history of hepatitis B.  Referred to Dr. Bonnielee Haff from Dr. Christella Hartigan for possible TIPS procedure.  The patient was counseled with Dr. Bonnielee Haff and procedure is scheduled for transjugular intrahepatic portosystemic shunt placement on September 12, 2010.  The patient is agreeable to proceed.  Labs were checked and the patient was examined.  PAST MEDICAL HISTORY: 1. Cirrhosis. 2. Variceal bleed. 3. Portal hypertension.  PAST SURGICAL HISTORY:  Inguinal hernia repair.  MEDICATIONS: 1. Aldactone 25 mg. 2. Propanolol 10 mg.  ALLERGIES:  None.  OBJECTIVE:  HEENT:  Extraocular movements intact. GENERAL:  Alert and oriented; appropriate. HEART:  Regular rate and rhythm without murmur. LUNGS:  Clear to auscultation. EXTREMITIES:  Full range of motion. ABDOMEN:  Positive bowel sounds; nontender; no masses; no ascites appreciated.  ASSESSMENT:  Variceal bleeding/cirrhosis.  PLAN: 1. Scheduled for transjugular intrahepatic portosystemic shunt     placement today with Dr. Maryclare Bean. 2. Plan for admit overnight to Dr. Bonnielee Haff and probable discharge in 24     hours.     [place] Cellar Laural Benes.   ______________________________ Art A. Umer Harig, M.D.    PAT/MEDQ  D:  09/12/2010  T:  09/13/2010  Job:  [id number]  Electronically Signed by Ralene Muskrat P.A. on 09/23/2010 11:55:22 AM Electronically Signed by Maryclare Bean M.D. on 06/26/2011 02:09:56 PM

## 2011-07-21 ENCOUNTER — Other Ambulatory Visit: Payer: Self-pay | Admitting: Interventional Radiology

## 2011-07-21 DIAGNOSIS — K7460 Unspecified cirrhosis of liver: Secondary | ICD-10-CM

## 2011-07-21 DIAGNOSIS — Z95828 Presence of other vascular implants and grafts: Secondary | ICD-10-CM

## 2011-07-22 ENCOUNTER — Other Ambulatory Visit: Payer: Self-pay | Admitting: Gastroenterology

## 2011-07-22 DIAGNOSIS — K7460 Unspecified cirrhosis of liver: Secondary | ICD-10-CM

## 2011-07-22 DIAGNOSIS — B1910 Unspecified viral hepatitis B without hepatic coma: Secondary | ICD-10-CM

## 2011-08-05 ENCOUNTER — Ambulatory Visit
Admission: RE | Admit: 2011-08-05 | Discharge: 2011-08-05 | Disposition: A | Discharge status: Home / Self Care | Payer: PRIVATE HEALTH INSURANCE | Source: Ambulatory Visit | Attending: Interventional Radiology | Admitting: Interventional Radiology

## 2011-08-05 ENCOUNTER — Ambulatory Visit
Admission: RE | Admit: 2011-08-05 | Discharge: 2011-08-05 | Disposition: A | Discharge status: Home / Self Care | Payer: PRIVATE HEALTH INSURANCE | Source: Ambulatory Visit | Attending: Gastroenterology | Admitting: Gastroenterology

## 2011-08-05 DIAGNOSIS — K7460 Unspecified cirrhosis of liver: Secondary | ICD-10-CM

## 2011-08-05 DIAGNOSIS — Z95828 Presence of other vascular implants and grafts: Secondary | ICD-10-CM

## 2011-08-05 DIAGNOSIS — B1910 Unspecified viral hepatitis B without hepatic coma: Secondary | ICD-10-CM

## 2011-08-13 ENCOUNTER — Other Ambulatory Visit: Payer: Self-pay | Admitting: Radiology

## 2011-10-09 ENCOUNTER — Other Ambulatory Visit: Payer: Self-pay

## 2011-10-09 DIAGNOSIS — K7460 Unspecified cirrhosis of liver: Secondary | ICD-10-CM

## 2011-10-09 DIAGNOSIS — B1910 Unspecified viral hepatitis B without hepatic coma: Secondary | ICD-10-CM

## 2011-10-14 ENCOUNTER — Other Ambulatory Visit: Payer: Self-pay | Admitting: Interventional Radiology

## 2011-10-14 ENCOUNTER — Telehealth: Payer: Self-pay | Admitting: Gastroenterology

## 2011-10-14 DIAGNOSIS — B1910 Unspecified viral hepatitis B without hepatic coma: Secondary | ICD-10-CM

## 2011-10-14 DIAGNOSIS — K7460 Unspecified cirrhosis of liver: Secondary | ICD-10-CM

## 2011-10-15 ENCOUNTER — Telehealth: Payer: Self-pay | Admitting: Gastroenterology

## 2011-10-15 DIAGNOSIS — K7460 Unspecified cirrhosis of liver: Secondary | ICD-10-CM

## 2011-10-15 NOTE — Telephone Encounter (Signed)
See alternate phone note  

## 2011-10-15 NOTE — Telephone Encounter (Signed)
The patient has been notified of the lab information.

## 2011-11-07 ENCOUNTER — Ambulatory Visit: Payer: PRIVATE HEALTH INSURANCE | Admitting: Gastroenterology

## 2011-11-11 ENCOUNTER — Ambulatory Visit
Admission: RE | Admit: 2011-11-11 | Discharge: 2011-11-11 | Disposition: A | Discharge status: Home / Self Care | Payer: PRIVATE HEALTH INSURANCE | Source: Ambulatory Visit | Attending: Gastroenterology | Admitting: Gastroenterology

## 2011-11-11 ENCOUNTER — Telehealth: Payer: Self-pay | Admitting: Interventional Radiology

## 2011-11-11 ENCOUNTER — Ambulatory Visit
Admission: RE | Admit: 2011-11-11 | Discharge: 2011-11-11 | Disposition: A | Discharge status: Home / Self Care | Payer: PRIVATE HEALTH INSURANCE | Source: Ambulatory Visit | Attending: Interventional Radiology | Admitting: Interventional Radiology

## 2011-11-11 DIAGNOSIS — K7460 Unspecified cirrhosis of liver: Secondary | ICD-10-CM

## 2011-11-11 DIAGNOSIS — B1910 Unspecified viral hepatitis B without hepatic coma: Secondary | ICD-10-CM

## 2011-11-11 NOTE — Progress Notes (Signed)
Patient ID: Gordon Huerta, male   DOB: 11/27/1966, 47 y.o.   MRN: [id number]   Transplant coordinator  At [facility]:  Cathie Beams Ph [phone] Fax [phone]

## 2011-11-14 ENCOUNTER — Other Ambulatory Visit (HOSPITAL_COMMUNITY): Payer: Self-pay | Admitting: Interventional Radiology

## 2011-11-14 DIAGNOSIS — Z95828 Presence of other vascular implants and grafts: Secondary | ICD-10-CM

## 2011-11-18 ENCOUNTER — Encounter: Payer: Self-pay | Admitting: Gastroenterology

## 2011-11-18 ENCOUNTER — Ambulatory Visit (INDEPENDENT_AMBULATORY_CARE_PROVIDER_SITE_OTHER): Payer: PRIVATE HEALTH INSURANCE | Admitting: Gastroenterology

## 2011-11-18 VITALS — BP 130/70 | HR 70 | Ht 75.0 in | Wt 268.0 lb

## 2011-11-18 DIAGNOSIS — K7460 Unspecified cirrhosis of liver: Secondary | ICD-10-CM

## 2011-11-18 NOTE — Progress Notes (Signed)
Review of gastrointestinal problems:  1. 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 antibody was positive E. antigen was negative hepatitis B surface antibody negative hepatitis B surface antigen was positive)  Last imaging 2009 ultrasound suggested cirrhosis without discrete lesions  Last alpha-fetoprotein was normal 2013  History of variceal bleeding, [place] in [place]. Also bled in 2008 in [place]  EGD by Dr. Christella Hartigan 2009, portal gastropathy and esophageal varices noted, these were banded.  EGD 2011 in [place], found esophageal and gastric varices  [facility].  C/b narrowing of TIPS at PV entry. Revised 2012, to be revised again in near future after 3/13 US showed return of narrowing. Listed for liver transplant through [facility], Dr. Hamilton Capri.     HPI: This is a  very pleasant 46 year old man whom I last saw 8-9 months ago.  He had blood drawn at [facility] about a week ago and this shows his meld score is 14. His alpha-fetoprotein was checked and it was normal.  Doppler ultrasound of his TIPS 2 weeks ago showed narrowing again at the portal vein entry site. He is planning to have a TIPS dilated about a week from now.  Past Medical History  Diagnosis Date  . Cirrhosis   . Hepatitis   . Esophageal varices   . Portal hypertension     Past Surgical History  Procedure Date  . Hernia repair   . Tips procedure     Current Outpatient Prescriptions  Medication Sig Dispense Refill  . aspirin 81 MG tablet Take 81 mg by mouth daily.      Marland Kitchen lactulose (CHRONULAC) 10 GM/15ML solution Take 10 g by mouth daily. Patient to take 10 g/15 ml qd.  Dispense 2 bottles.  May refill x 2.  (total Rx for 3 mo)  Rx called to pharmacist at [facility] on [address]  Order per Dr Art Bonnielee Haff          Allergies as of 11/18/2011  . (No Known Allergies)    Family History  Problem Relation Age  of Onset  . Liver cancer Mother   . Heart attack Father     History   Social History  . Marital Status: Married    Spouse Name: N/A    Number of Children: N/A  . Years of Education: N/A   Occupational History  . Entrepreneur    Social History Main Topics  . Smoking status: Never Smoker   . Smokeless tobacco: Never Used  . Alcohol Use: No  . Drug Use: Not on file  . Sexually Active: Not on file   Other Topics Concern  . Not on file   Social History Narrative  . No narrative on file      Physical Exam: BP 130/70  Pulse 70  Ht 6\' 3"  (1.905 m)  Wt 268 lb (121.564 kg)  BMI 33.50 kg/m2 Constitutional: generally well-appearing Psychiatric: alert and oriented x3 Abdomen: soft, nontender, nondistended, no obvious ascites, no peritoneal signs, normal bowel sounds     Assessment and plan: 46 y.o. male with  chronic hepatitis B,  cirrhosis.  He willl be back at [facility] this summer. He is going to have his TIPS revised. I am happy to see him again whenever he needs. I am also happy to order any testing that his transplant hepatologist feels as needed however I don't want to order any  on my own so that we do not duplicate testing.

## 2011-11-18 NOTE — Patient Instructions (Signed)
Call with any troubles. Dr. Christella Hartigan is happy to perform any testing, blood tests, imaging tests which are requested by her transplant hepatologist. Also happy to help out in any emergent situations.

## 2011-11-19 ENCOUNTER — Other Ambulatory Visit: Payer: Self-pay | Admitting: Radiology

## 2011-11-24 ENCOUNTER — Encounter (HOSPITAL_COMMUNITY): Payer: Self-pay | Admitting: Pharmacy Technician

## 2011-11-25 ENCOUNTER — Ambulatory Visit (HOSPITAL_COMMUNITY)
Admission: RE | Admit: 2011-11-25 | Discharge: 2011-11-25 | Disposition: A | Discharge status: Home / Self Care | Payer: 59 | Source: Ambulatory Visit | Attending: Interventional Radiology | Admitting: Interventional Radiology

## 2011-11-25 DIAGNOSIS — K7460 Unspecified cirrhosis of liver: Secondary | ICD-10-CM | POA: Insufficient documentation

## 2011-11-25 DIAGNOSIS — I10 Essential (primary) hypertension: Secondary | ICD-10-CM | POA: Insufficient documentation

## 2011-11-25 DIAGNOSIS — Z7982 Long term (current) use of aspirin: Secondary | ICD-10-CM | POA: Insufficient documentation

## 2011-11-25 DIAGNOSIS — K766 Portal hypertension: Secondary | ICD-10-CM | POA: Insufficient documentation

## 2011-11-25 DIAGNOSIS — I871 Compression of vein: Secondary | ICD-10-CM | POA: Insufficient documentation

## 2011-11-25 DIAGNOSIS — B181 Chronic viral hepatitis B without delta-agent: Secondary | ICD-10-CM | POA: Insufficient documentation

## 2011-11-25 DIAGNOSIS — Z95828 Presence of other vascular implants and grafts: Secondary | ICD-10-CM

## 2011-11-25 LAB — COMPREHENSIVE METABOLIC PANEL
AST: 67 U/L — ABNORMAL HIGH (ref 0–37)
CO2: 26 mEq/L (ref 19–32)
Calcium: 8.8 mg/dL (ref 8.4–10.5)
Creatinine, Ser: 1.08 mg/dL (ref 0.50–1.35)
GFR calc Af Amer: >90 mL/min (ref >90)
GFR calc non Af Amer: 81 mL/min — ABNORMAL LOW (ref >90)
Glucose, Bld: 98 mg/dL (ref 70–99)

## 2011-11-25 LAB — CBC
MCH: 23.5 pg — ABNORMAL LOW (ref 26.0–34.0)
MCV: 74.4 fL — ABNORMAL LOW (ref 78.0–100.0)
Platelets: 35 10*3/uL — ABNORMAL LOW (ref 150–400)
RBC: 5.23 MIL/uL (ref 4.22–5.81)

## 2011-11-25 LAB — PROTIME-INR: INR: 1.74 — ABNORMAL HIGH (ref 0.00–1.49)

## 2011-11-25 MED ORDER — LIDOCAINE HCL 1 % IJ SOLN
INTRAMUSCULAR | Status: AC
  Filled 2011-11-25: qty 20

## 2011-11-25 MED ORDER — FENTANYL CITRATE 0.05 MG/ML IJ SOLN
INTRAMUSCULAR | Status: DC | PRN
  Administered 2011-11-25: 50 ug via INTRAVENOUS
  Administered 2011-11-25: 100 ug via INTRAVENOUS

## 2011-11-25 MED ORDER — FENTANYL CITRATE 0.05 MG/ML IJ SOLN
INTRAMUSCULAR | Status: AC
  Filled 2011-11-25: qty 6

## 2011-11-25 MED ORDER — MIDAZOLAM HCL 2 MG/2ML IJ SOLN
INTRAMUSCULAR | Status: AC
  Filled 2011-11-25: qty 6

## 2011-11-25 MED ORDER — SODIUM CHLORIDE 0.9 % IV SOLN
Freq: Once | INTRAVENOUS | Status: AC
  Administered 2011-11-25: 09:00:00 via INTRAVENOUS

## 2011-11-25 MED ORDER — CIPROFLOXACIN IN D5W 400 MG/200ML IV SOLN
400.0000 mg | Freq: Once | INTRAVENOUS | Status: AC
  Administered 2011-11-25: 400 mg via INTRAVENOUS

## 2011-11-25 MED ORDER — MIDAZOLAM HCL 5 MG/5ML IJ SOLN
INTRAMUSCULAR | Status: DC | PRN
  Administered 2011-11-25 (×3): 1 mg via INTRAVENOUS

## 2011-11-25 MED ORDER — IOHEXOL 300 MG/ML  SOLN: 25.0000 mL | Freq: Once | INTRAMUSCULAR | Status: DC | PRN

## 2011-11-25 NOTE — H&P (Signed)
Gordon Huerta is an 46 y.o. male.   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  Past Medical History  Diagnosis Date  . Cirrhosis   . Hepatitis   . Esophageal varices   . Portal hypertension     Past Surgical History  Procedure Date  . Hernia repair   . Tips procedure     Family History  Problem Relation Age of Onset  . Liver cancer Mother   . Heart attack Father    Social History:  reports that he has never smoked. He has never used smokeless tobacco. He reports that he does not drink alcohol. His drug history not on file.  Allergies: No Known Allergies  Medications Prior to Admission  Medication Sig Dispense Refill  . aspirin 81 MG tablet Take 81 mg by mouth daily.      . COD LIVER OIL PO Take 1 capsule by mouth daily.      Marland Kitchen lactulose (CHRONULAC) 10 GM/15ML solution Take 10 g by mouth daily. Patient to take 10 g/15 ml qd.  Dispense 2 bottles.  May refill x 2.  (total Rx for 3 mo)  Rx called to pharmacist at [facility] on [address]  Order per Dr Art Bonnielee Haff        . Multiple Vitamin (MULITIVITAMIN WITH MINERALS) TABS Take 1 tablet by mouth daily.       Medications Prior to Admission  Medication Dose Route Frequency Provider Last Rate Last Dose  . 0.9 %  sodium chloride infusion   Intravenous Once Robet Leu, PA      . ciprofloxacin (CIPRO) IVPB 400 mg  400 mg Intravenous Once Robet Leu, PA      . fentaNYL (SUBLIMAZE) 0.05 MG/ML injection           . lidocaine (XYLOCAINE) 1 % (with pres) injection           . midazolam (VERSED) 2 MG/2ML injection             Results for orders placed during the hospital encounter of 11/25/11 (from the past 48 hour(s))  APTT     Status: Abnormal   Collection Time   11/25/11  8:30 AM      Component Value Range  Comment   aPTT 44 (*) 24 - 37 (seconds)   CBC     Status: Abnormal (Preliminary result)   Collection Time   11/25/11  8:30 AM      Component Value Range Comment   WBC 1.8 (*) 4.0 - 10.5 (K/uL)    RBC 5.23  4.22 - 5.81 (MIL/uL)    Hemoglobin 12.3 (*) 13.0 - 17.0 (g/dL)    HCT 45.4 (*) 09.8 - 52.0 (%)    MCV 74.4 (*) 78.0 - 100.0 (fL)    MCH 23.5 (*) 26.0 - 34.0 (pg)    MCHC 31.6  30.0 - 36.0 (g/dL)    RDW 11.9 (*) 14.7 - 15.5 (%)    Platelets PENDING  150 - 400 (K/uL)   PROTIME-INR     Status: Abnormal   Collection Time   11/25/11  8:30 AM      Component Value Range Comment   Prothrombin Time 20.7 (*) 11.6 - 15.2 (seconds)    INR 1.74 (*) 0.00 - 1.49     No results found.  Review of Systems  Constitutional: Negative for fever and chills.  Respiratory: Negative for cough and shortness of breath.   Cardiovascular: Negative for chest pain.  Gastrointestinal: Negative for nausea, vomiting and abdominal pain.  Musculoskeletal: Negative for back pain.  Neurological: Negative for headaches.  Endo/Heme/Allergies: Does not bruise/bleed easily.    Blood pressure 128/86, pulse 45, temperature 97.7 F (36.5 C), temperature source Oral, resp. rate 12, height 6\' 3"  (1.905 m), weight 268 lb (121.564 kg), SpO2 98.00%. Physical Exam  Constitutional: He is oriented to person, place, and time. He appears well-developed and well-nourished.  Cardiovascular: Regular rhythm.        bradycardic  Respiratory: Effort normal and breath sounds normal.  GI: Soft. He exhibits no distension. There is no tenderness.  Musculoskeletal: Normal range of motion.       Trace pretibial edema  Neurological: He is alert and oriented to person, place, and time.     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 Details/risks of procedure d/w pt with his understanding and  consent.  Gordon Huerta,[name] 11/25/2011, 9:02 AM

## 2011-11-25 NOTE — Discharge Instructions (Addendum)
                  Seek immediate care if you have bleeding at site. Rest remainder of day. May remove dressing in 24 hours and bathe as usual.        Moderate Sedation, Adult Moderate sedation is given to help you relax or even sleep through a procedure. You may remain sleepy, be clumsy, or have poor balance for several hours following this procedure. Arrange for a responsible adult, family member, or friend to take you home. A responsible adult should stay with you for at least 24 hours or until the medicines have worn off.  Do not participate in any activities where you could become injured for the next 24 hours, or until you feel normal again. Do not:   Drive.   Swim.   Ride a bicycle.   Operate heavy machinery.   Cook.   Use power tools.   Climb ladders.   Work at [employer].   Do not make important decisions or sign legal documents until you are improved.   Vomiting may occur if you eat too soon. When you can drink without vomiting, try water, juice, or soup. Try solid foods if you feel little or no nausea.   Only take over-the-counter or prescription medications for pain, discomfort, or fever as directed by your caregiver.If pain medications have been prescribed for you, ask your caregiver how soon it is safe to take them.   Make sure you and your family fully understands everything about the medication given to you. Make sure you understand what side effects may occur.   You should not drink alcohol, take sleeping pills, or medications that cause drowsiness for at least 24 hours.   If you smoke, do not smoke alone.   If you are feeling better, you may resume normal activities 24 hours after receiving sedation.   Keep all appointments as scheduled. Follow all instructions.   Ask questions if you do not understand.  SEEK MEDICAL CARE IF:   Your skin is pale or bluish in color.   You continue to feel sick to your stomach (nauseous) or throw up  (vomit).   Your pain is getting worse and not helped by medication.   You have bleeding or swelling.   You are still sleepy or feeling clumsy after 24 hours.  SEEK IMMEDIATE MEDICAL CARE IF:   You develop a rash.   You have difficulty breathing.   You develop any type of allergic problem.   You have a fever.  Document Released: 05/06/2001 Document Revised: 07/31/2011 Document Reviewed: 09/27/2007 [facility] Patient Information 2012 [place], [place].

## 2011-11-25 NOTE — Progress Notes (Signed)
Ambulated to BR to void and tolerated this well

## 2011-11-25 NOTE — Procedures (Signed)
TIPS revision RIJV PTA 10 mm with visual success. No pressure measurements No comp

## 2012-01-15 ENCOUNTER — Other Ambulatory Visit: Payer: Self-pay

## 2012-01-15 DIAGNOSIS — K7460 Unspecified cirrhosis of liver: Secondary | ICD-10-CM

## 2012-03-15 ENCOUNTER — Inpatient Hospital Stay
Admission: RE | Admit: 2012-03-15 | Discharge: 2012-03-15 | Payer: PRIVATE HEALTH INSURANCE | Source: Ambulatory Visit | Attending: Gastroenterology | Admitting: Gastroenterology

## 2012-03-30 NOTE — Consult Note (Signed)
  The patient underwent TIPS [place] at an outside facility demonstrating patency of the shunt.The exam was performed 03/03/2012. Blood work at that time demonstrates Total Bili 2.4, INR 1.4, PLT 24k. He is to FU with me when he returns into the country.

## 2012-11-09 ENCOUNTER — Telehealth: Payer: Self-pay | Admitting: Emergency Medicine

## 2012-11-09 NOTE — Telephone Encounter (Signed)
EMAILED PT TO MAKE HIM AWARE THAT DR HOSS REVIEWED HIS US PERFORMED AT [facility].  THE [place] LOOKS GOOD AND DR HOSS DOES NOT NEED TO F/U WITH HIM AT THIS POINT UNLESS NEEDED AS LONG AS [facility] IS DOING A CLOSED FOLLOW UP.  TOLD PT TO EMAIL BACK IF ANY QUESTIONS OR CALL us WHEN HE IS IN THE [place] STATES.

## 2013-05-28 IMAGING — US US ART/VEN ABD/PELV/SCROTUM DOPPLER LTD
1 series · 14 of 17 positions shown · non-contrast
Comparison: None.

CLINICAL DATA: Five months status post TIPS placement

TIPS DOPPLER
TECHNIQUE: Color and duplex Doppler ultrasound was performed to
evaluate the TIPS.

[Series 1: us art/ven abd/pelv/scrotum doppler ltd · 0.32mm/px · 14 of 17 slices shown]
[im 1/17]
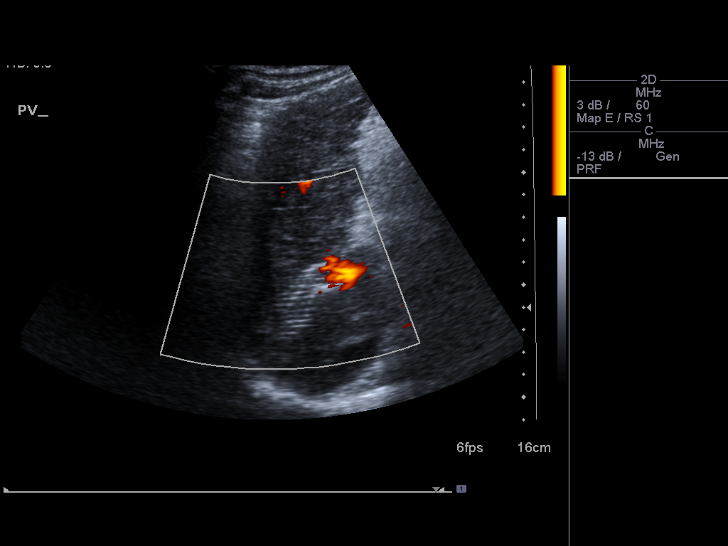
[im 2/17]
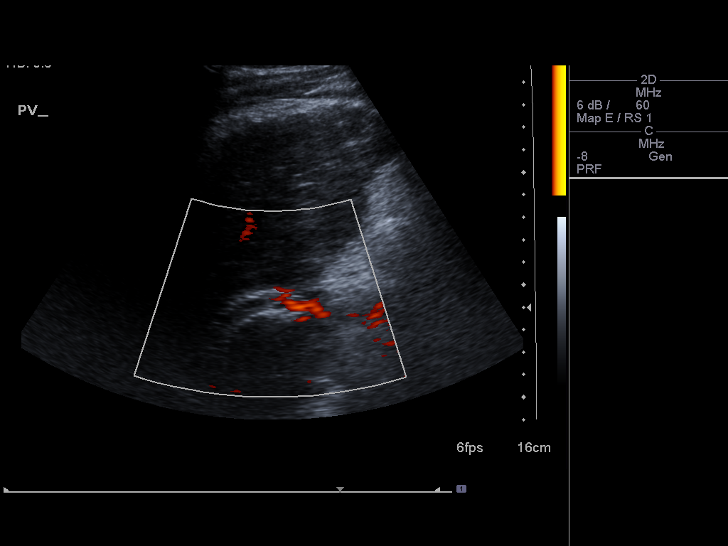
[im 4/17]
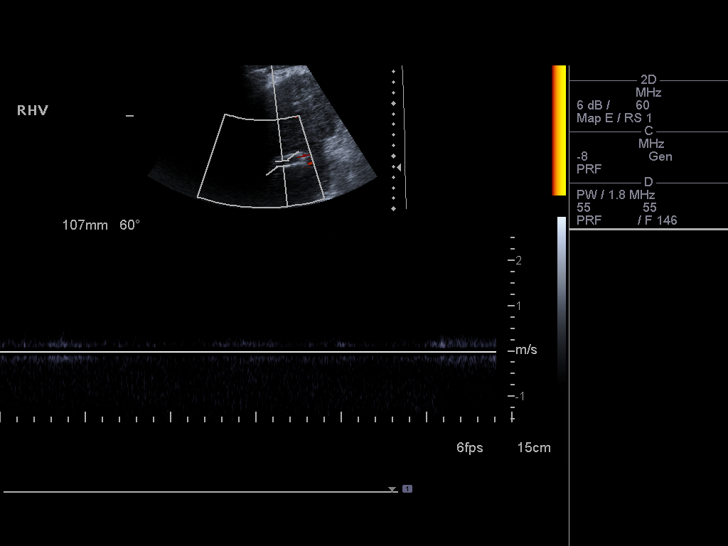
[im 5/17]
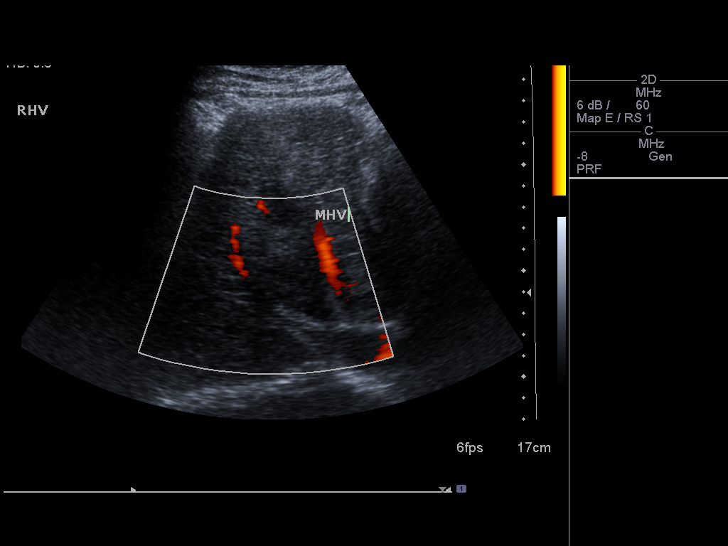
[im 6/17]
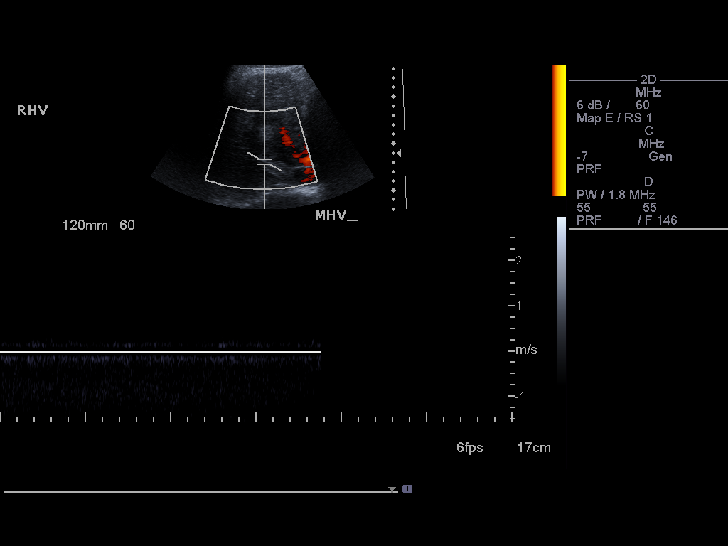
[im 7/17]
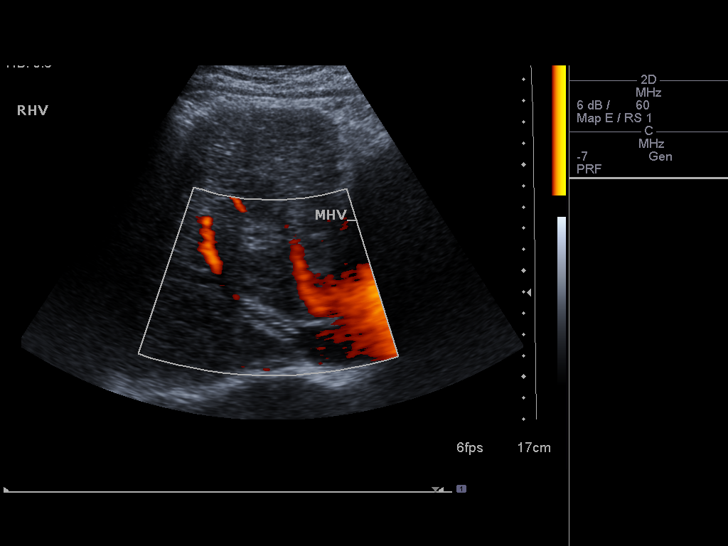
[im 8/17]
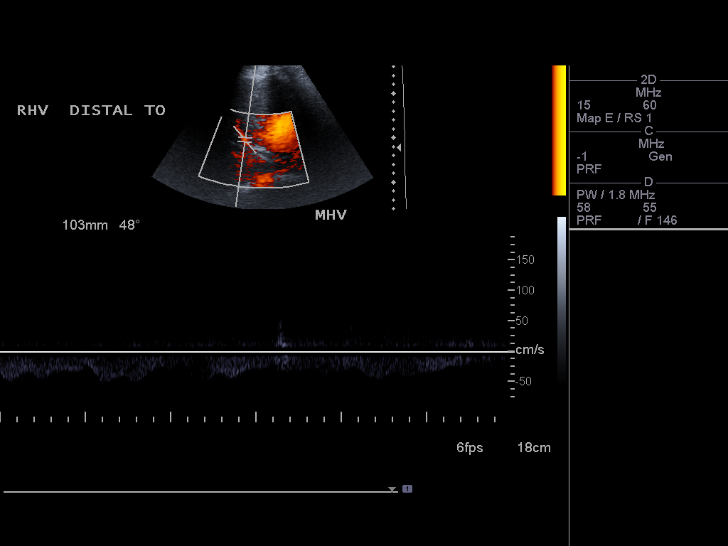
[im 10/17]
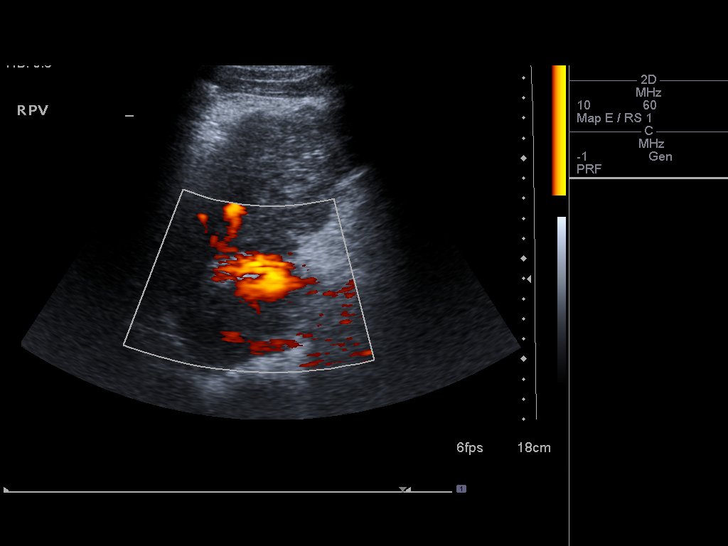
[im 11/17]
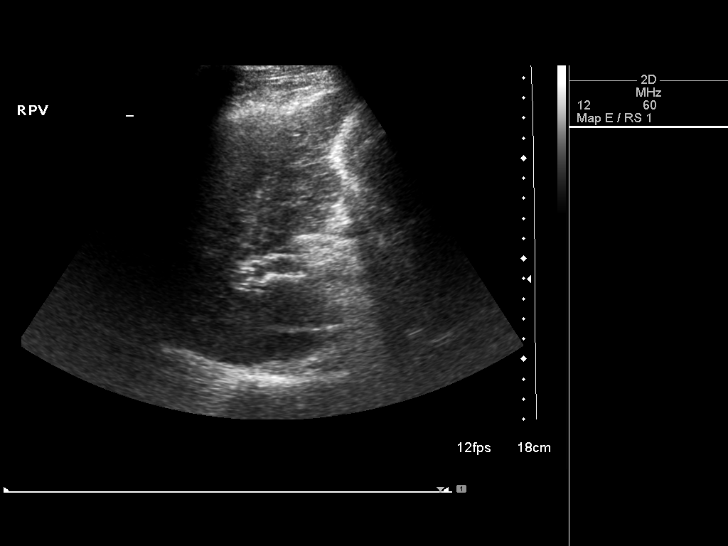
[im 12/17]
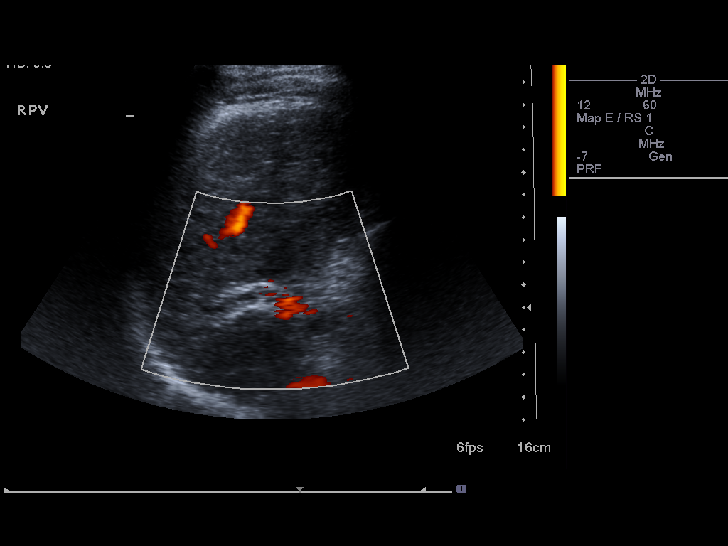
[im 13/17]
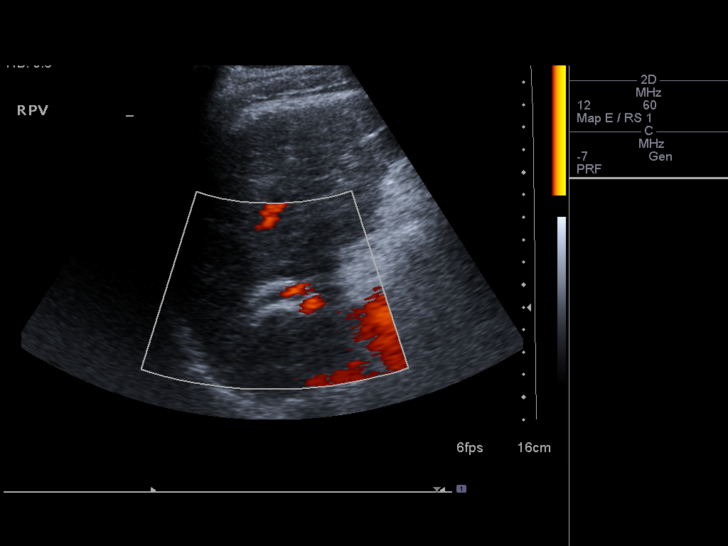
[im 14/17]
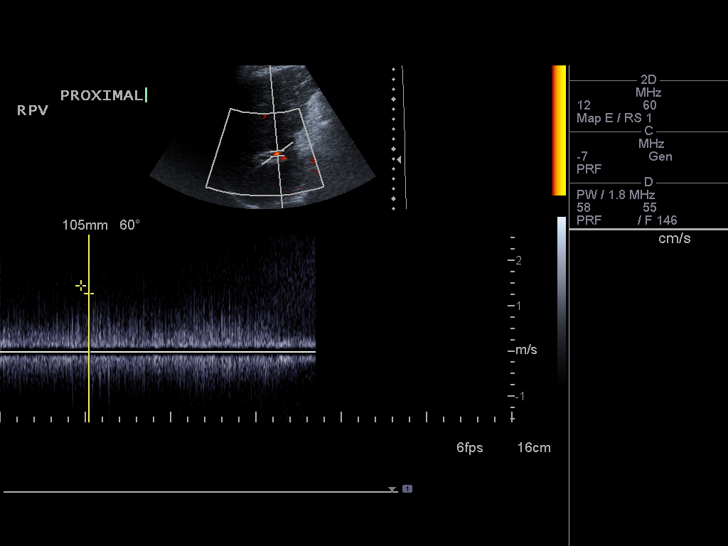
[im 16/17]
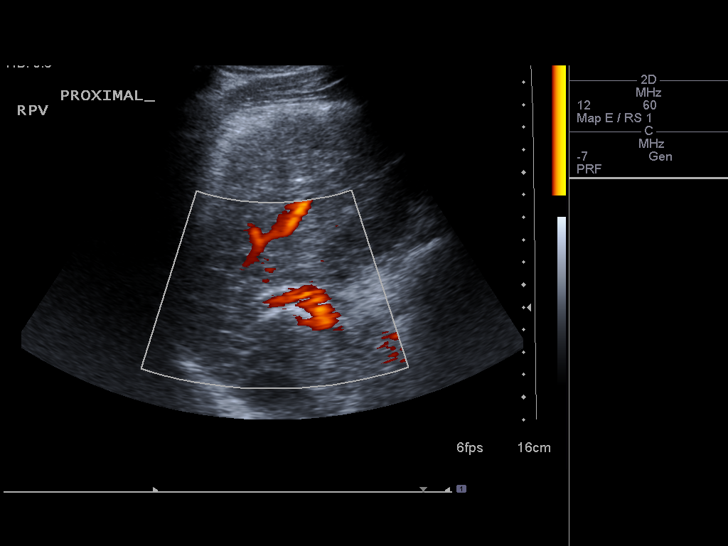
[im 17/17]
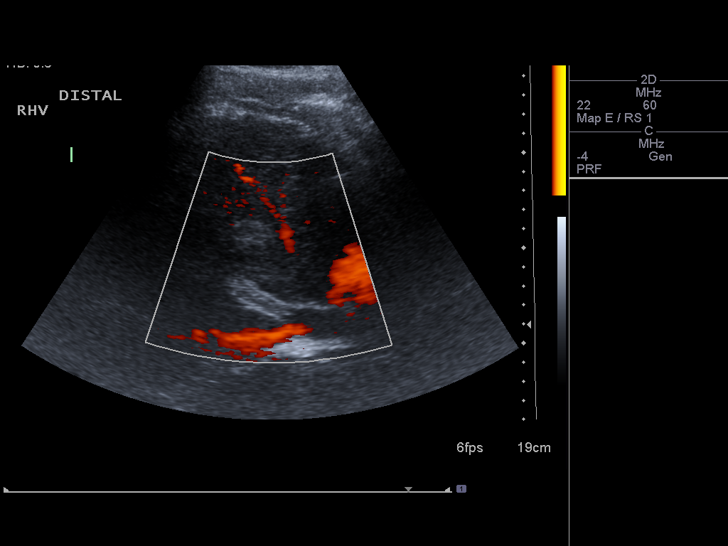

[14 of 17 positions shown; findings below may reference images not displayed]

Tips:  There is no detectable flow within the TIPS.

Portal Vein Velocities:
Main:      53 cm/sec
Right:      127 cm/sec
Left:        56 cm/sec

Hepatic Vein Velocities:
Right:     59 cm/sec
Middle:  33 cm/sec
Left:       75 cm/sec

Varices:   Absent
Ascites:   Absent
FINDINGS: As described, there is no definitive flow within the
TIPS.  Overlying bowel gas does limit evaluation of the tips.
Doppler analysis suggests some flow within the TIPS.  The liver is
again noted to be heterogeneous in echotexture without focal mass.
IMPRESSION: There is no definitive flow within the TIPS.  This may be due to
technical factors.  Subsequent TIPS venogram will be performed.

## 2013-05-29 IMAGING — XA IR TIPS REVISION
1 series · 13 of 20 positions shown · IV contrast (omnipaque)
Comparison: none

Clinical Data/Indication: SUSPECTED TIPS STENOSIS OR OCCLUSION

TIPS REVISION
Sedation: Versed 2 mg, Fentanyl 100 mg.
Total Moderate Sedation Time: 35 minutes.
Contrast Volume: 60 ml Omnipaque-EII.
Additional Medications: None.
Fluoroscopy Time: 6.2 minutes.
Procedure: The procedure, risks, benefits, and alternatives were
explained to the patient. Questions regarding the procedure were
encouraged and answered. The patient understands and consents to
the procedure.
The right neck was prepped with betadine in a sterile fashion, and
a sterile drape was applied covering the operative field. A sterile
gown and sterile gloves were used for the procedure.
Under sonographic guidance, a micropuncture needle was inserted
into the right internal jugular vein and the upsized to a a three
J.  Sonographic and fluoroscopic documentation was obtained.
A 10-French sheath was advanced over the wire into the IVC.  A 5-
French MPA catheter was advanced over the three J into the TIPS and
into the portal vein.  Venography was performed.  A pressure
gradient of 15 mmHg was obtained from the portal vein to the right
atrium.
The distal end of the tips was then dilated to 10 mm.  Repeat
imaging was performed.  A final pressure gradient of 10 mmHg was
obtained.

[Series 1: run · 13 of 20 slices shown]
[im 1/20]
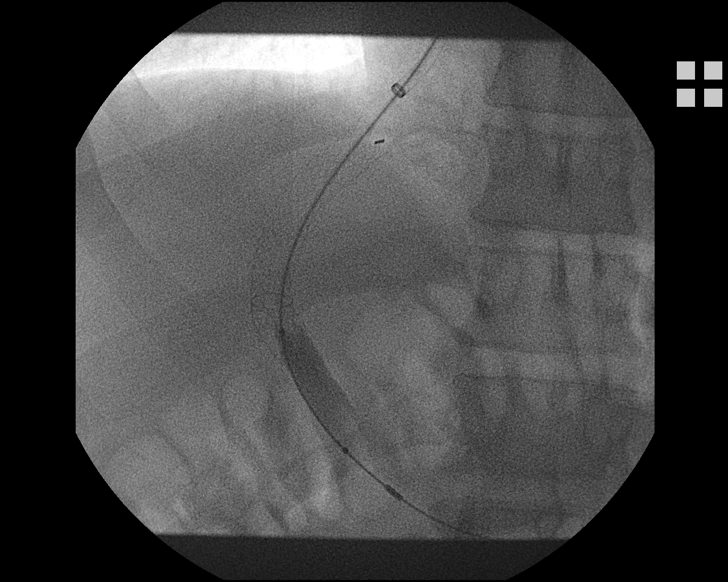
[im 3/20]
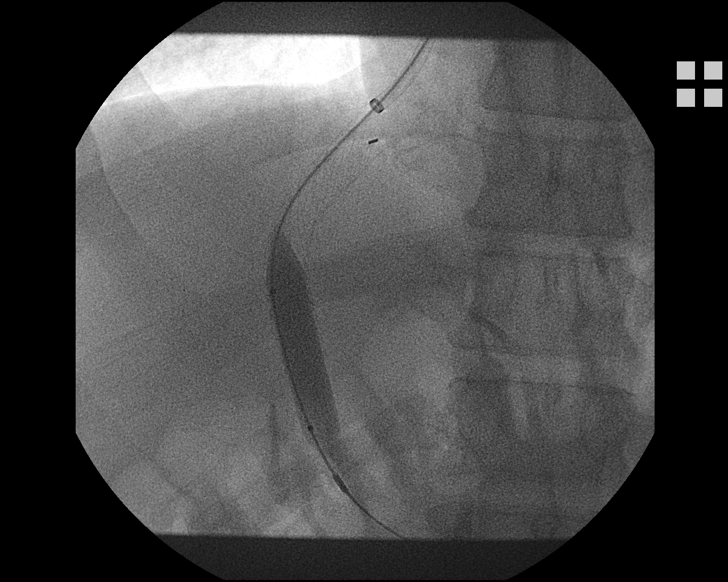
[im 4/20]
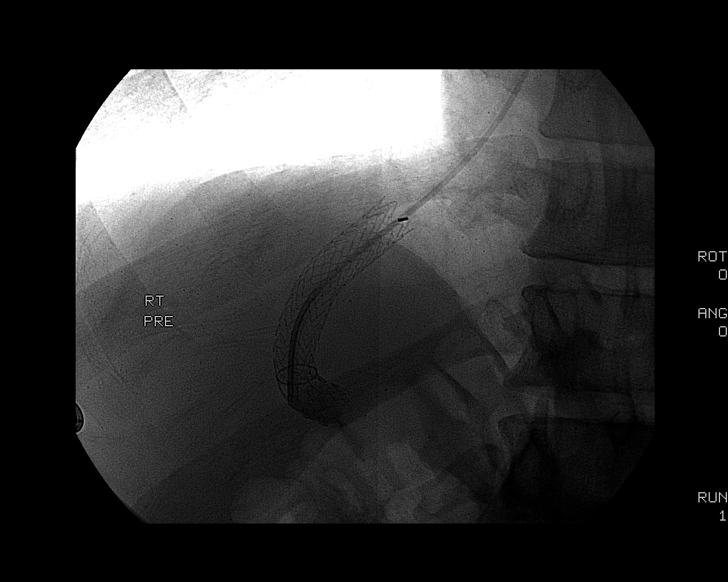
[im 6/20]
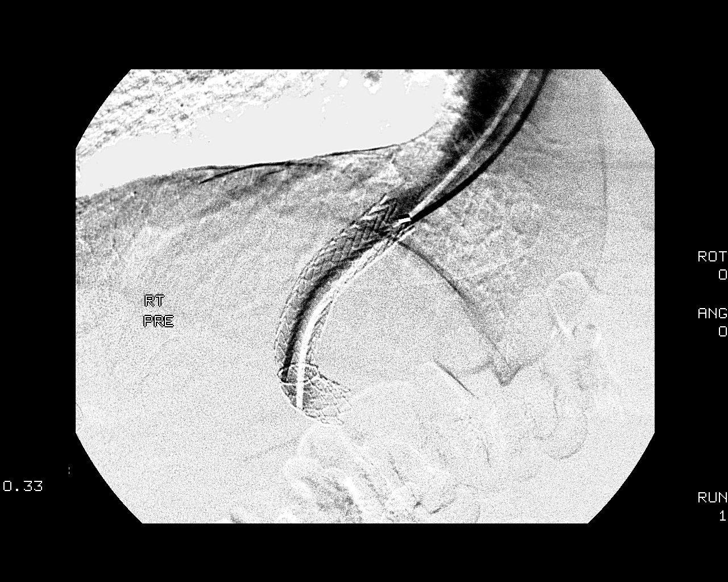
[im 7/20]
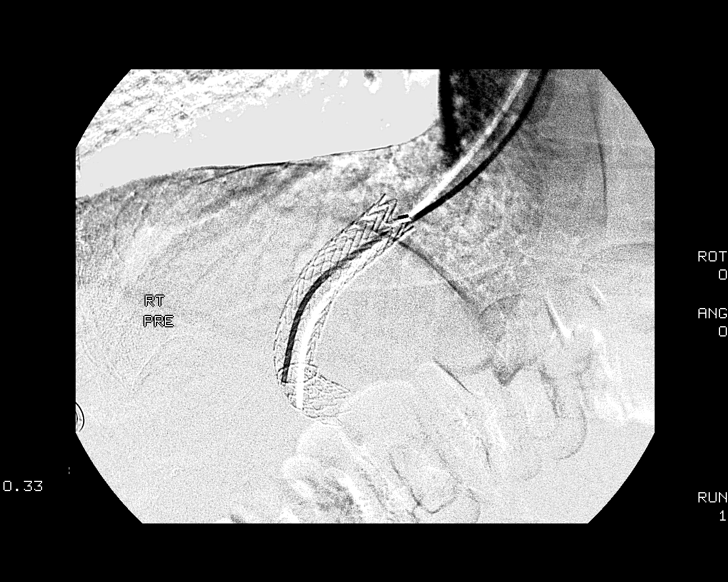
[im 9/20]
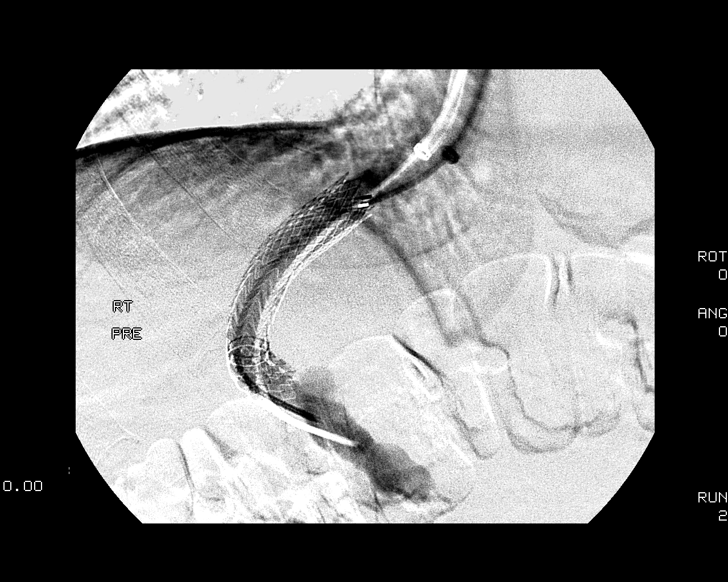
[im 11/20]
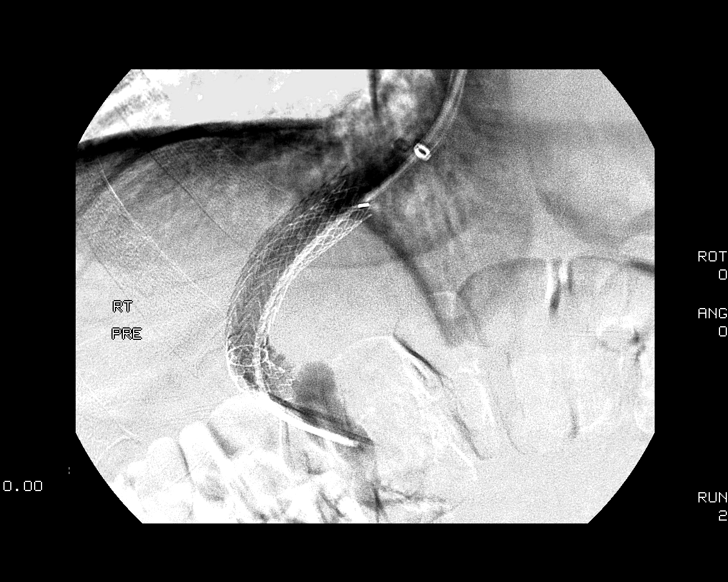
[im 12/20]
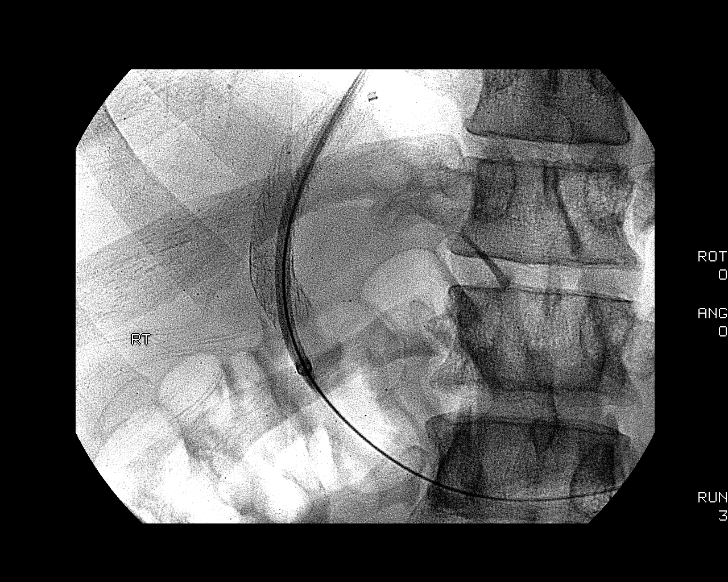
[im 14/20]
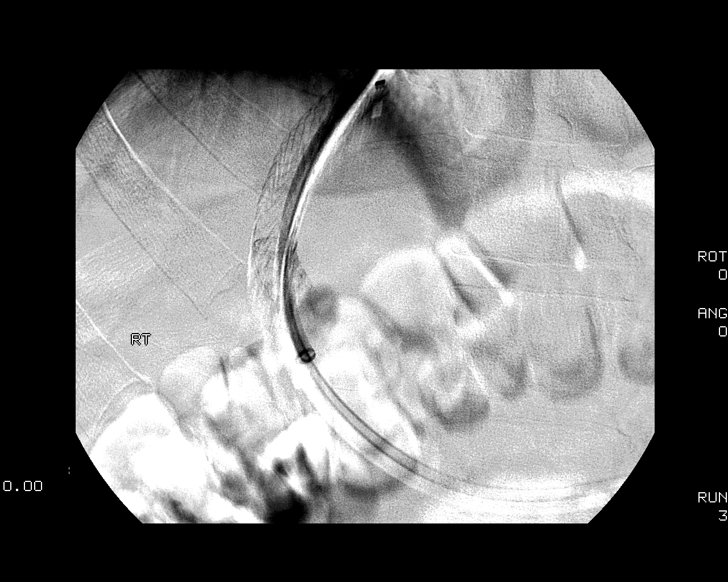
[im 15/20]
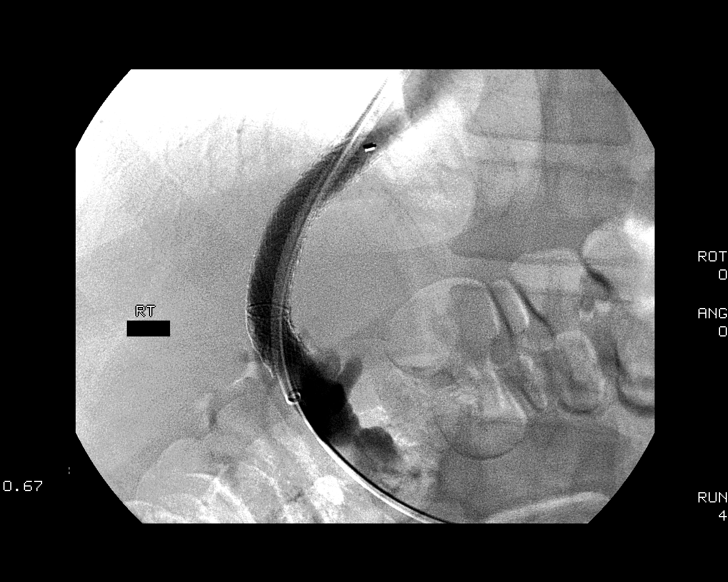
[im 17/20]
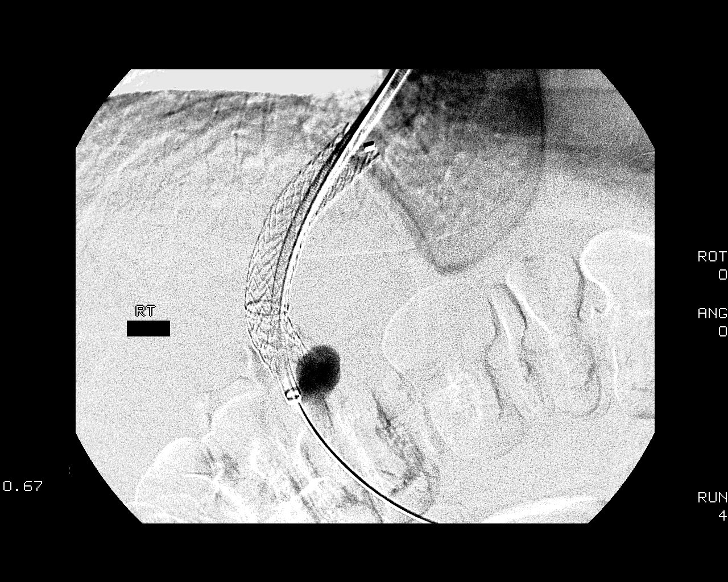
[im 18/20]
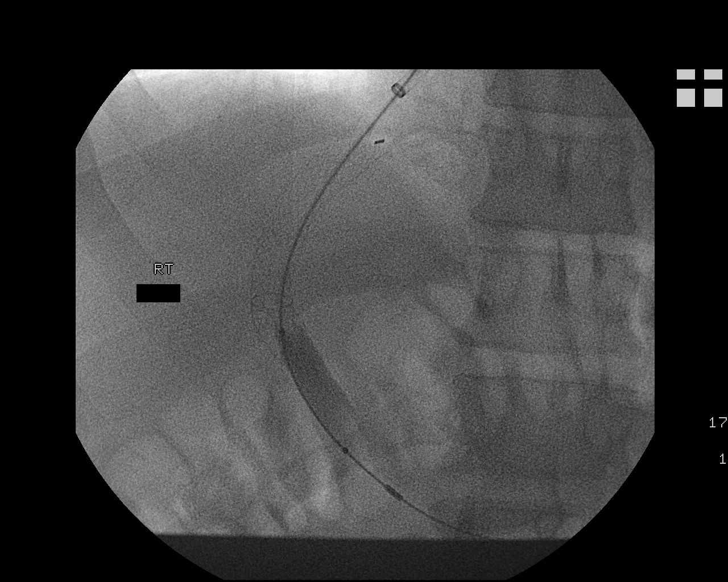
[im 20/20]
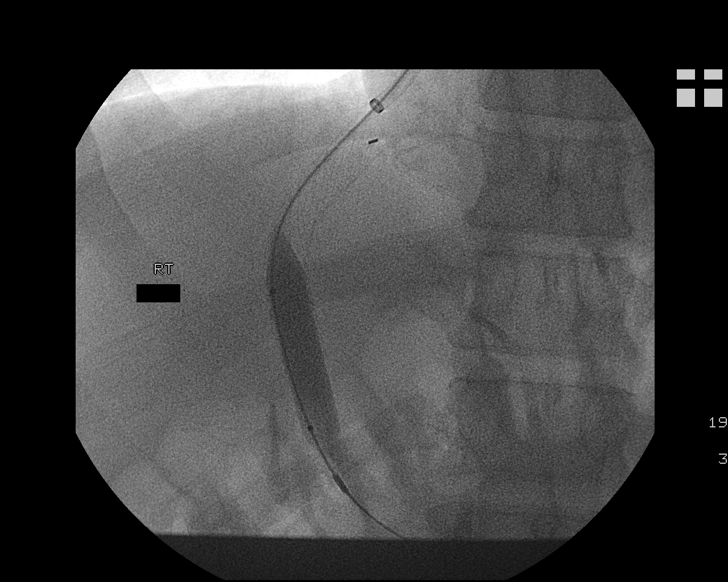

[13 of 20 positions shown; findings below may reference images not displayed]

FINDINGS: Initial TIPS venogram demonstrates mild narrowing of the
tips at the portal vein entry site as well as within the portal
vein adjacent to the TIPS.

After dilatation to 10 mm, the portal vein and tips are noted to be
widely patent.

Complications: None
IMPRESSION: Successful dilatation of narrowing in the inferior end of the TIPS,
to 10 mm.  Pre revision gradient was 15 mmHg.  A final gradient of
10 mmHg was achieved.

## 2013-07-08 IMAGING — XA IR TIPS REVISION
1 series · 14 of 24 positions shown · IV contrast (IODINE)
Comparison: none

Clinical Data/Indication: TIPS.  ABNORMAL DOPPLER OF THE TIPS.

TIPS REVISION
Sedation: Versed 4 mg, Fentanyl 100 mg.
Total Moderate Sedation Time: 45 minutes.
Contrast Volume: 50 ml Smnipaque-122.
Additional Medications: None.
Fluoroscopy Time: 7.4 minutes.
Procedure: The procedure, risks, benefits, and alternatives were
explained to the patient. Questions regarding the procedure were
encouraged and answered. The patient understands and consents to
the procedure.
The right neck was prepped with betadine in a sterile fashion, and
a sterile drape was applied covering the operative field. A sterile
gown and sterile gloves were used for the procedure.
Under sonographic guidance, a micropuncture needle was inserted
into the right internal jugular vein and removed over a Benson
wire.  A 10-French sheath was inserted to the IVC.
An MPA catheter was advanced over the Christiansen into the TIPS and into
the portal vein.  Pre revision gradient was measured at 11 mmHg.
Venography was performed.
The portal vein and inferior aspect of the tips were dilated to 10
mm.  Repeat imaging was performed.  Final gradient of 8 mmHg was
obtained. The sheath was removed.  Hemostasis was achieved with
direct pressure.

[Series 300: sp tips revision · 14 of 25 slices shown]
[im 1/25]
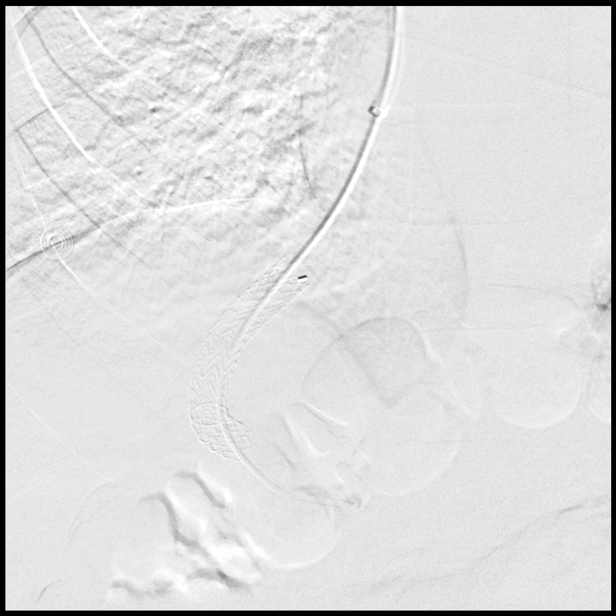
[im 3/25]
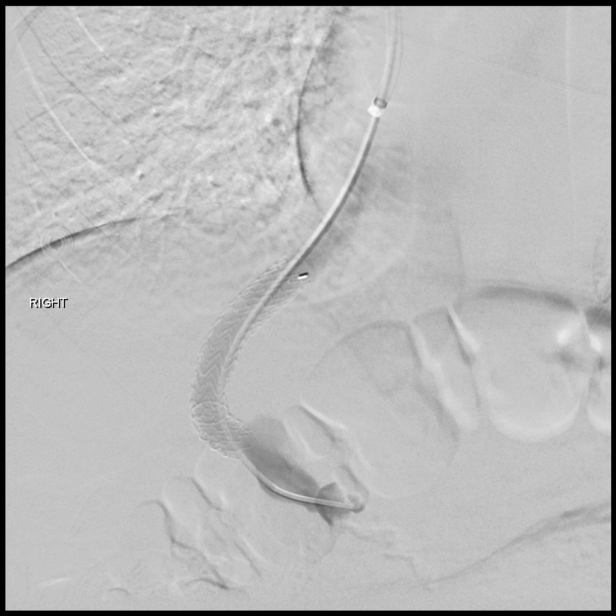
[im 5/25]
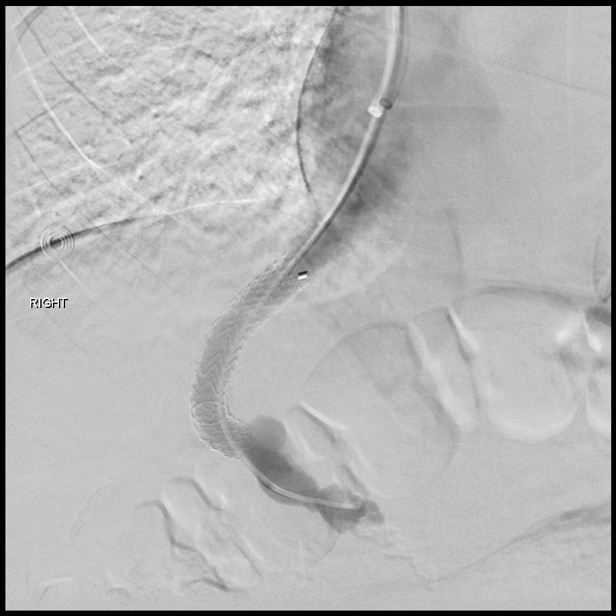
[im 7/25]
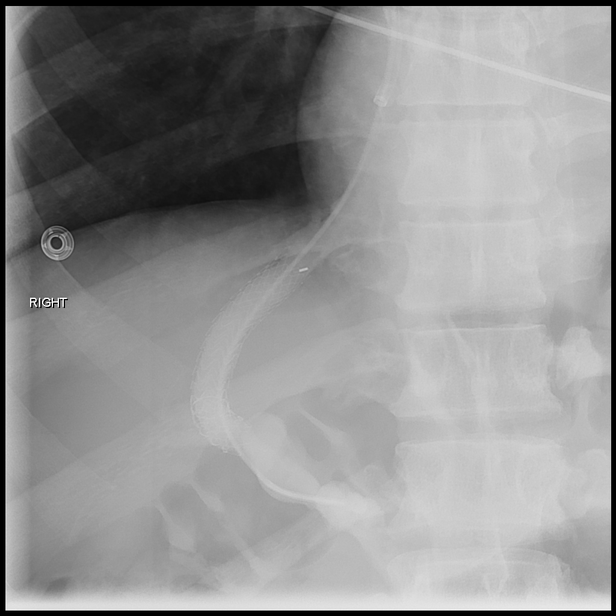
[im 8/25]
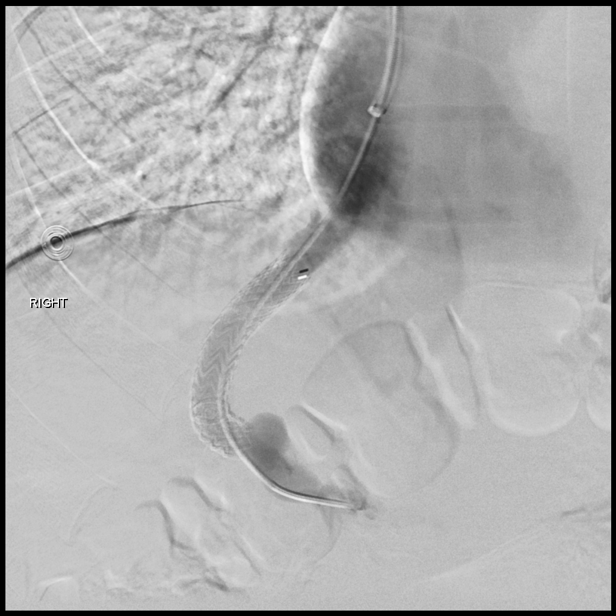
[im 10/25]
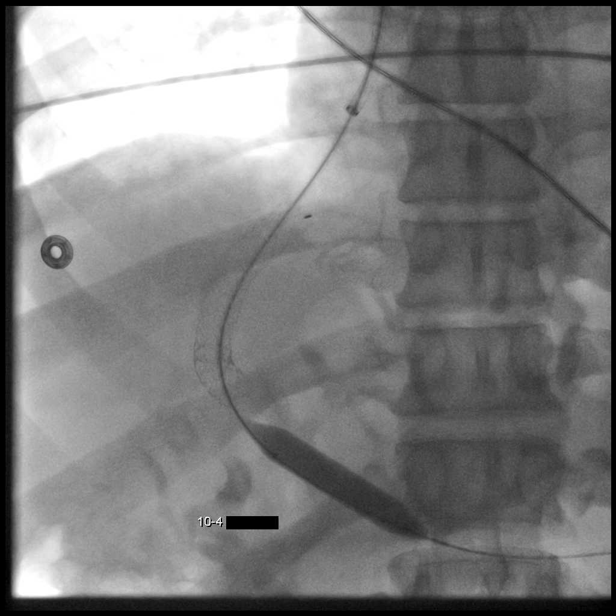
[im 12/25]
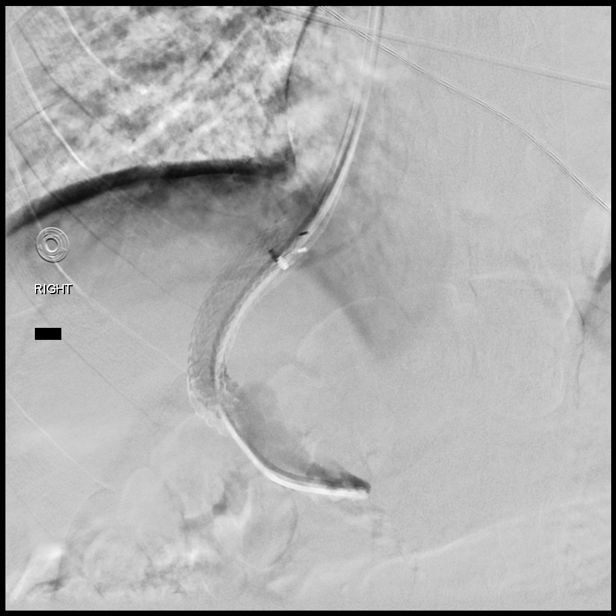
[im 13/25]
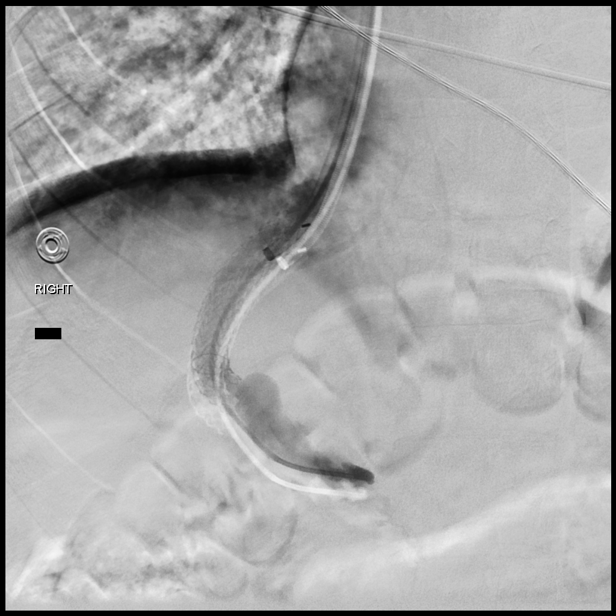
[im 15/25]
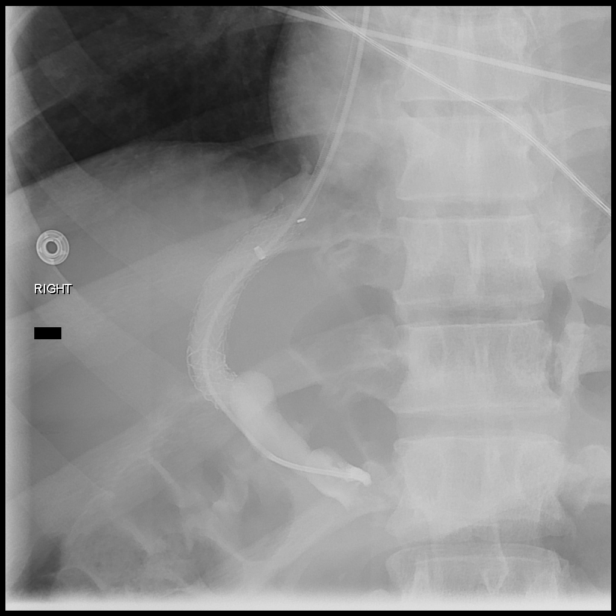
[im 17/25]
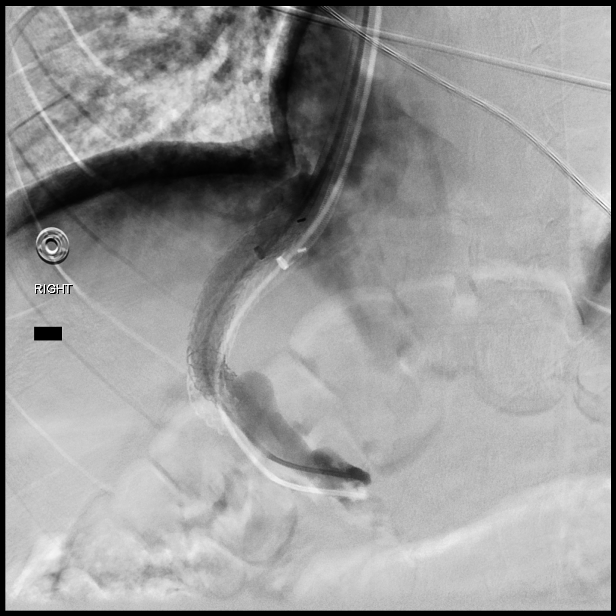
[im 19/25]
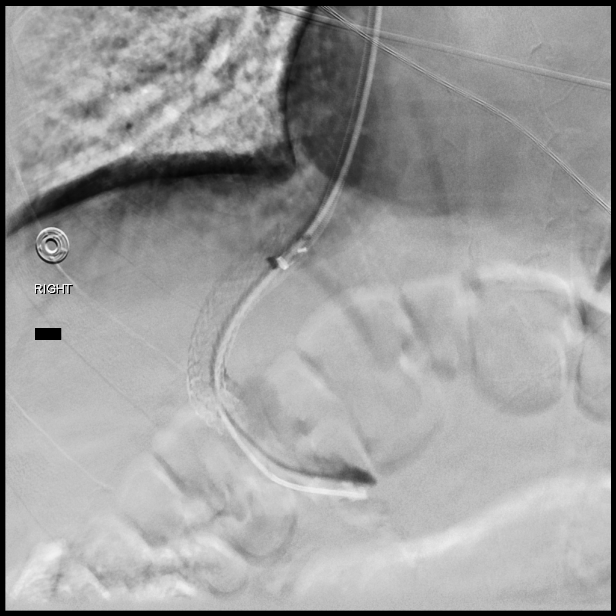
[im 20/25]
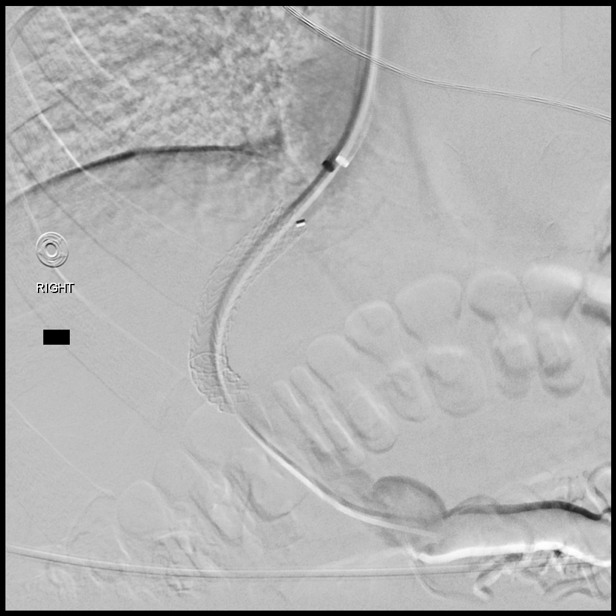
[im 22/25]
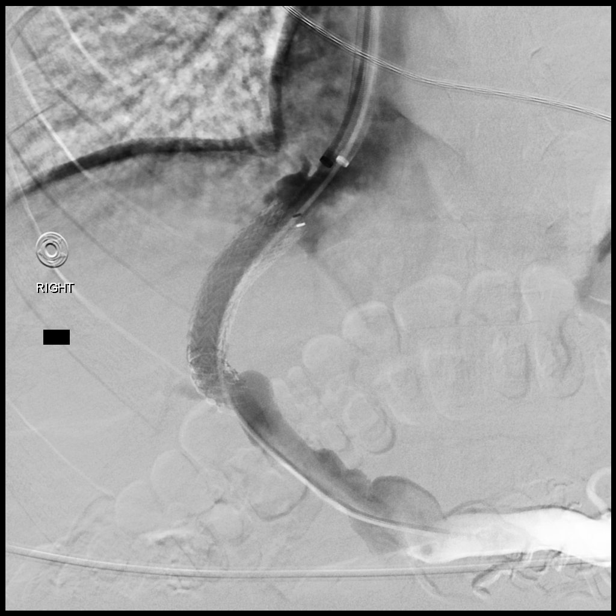
[im 25/25]
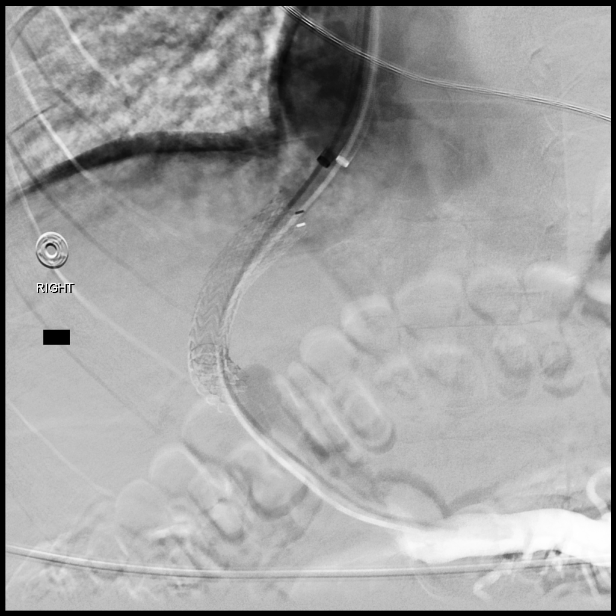

[14 of 24 positions shown; findings below may reference images not displayed]

FINDINGS: Initial venogram demonstrates narrowing of the portal
vein in the mid aspect as well as narrowing of the portal vein at
the TIPS entry site.

Post dilatation of imaging demonstrates wide patency of the portal
vein and TIPS.  No evidence of vessel injury.

Complications: None.
IMPRESSION: Venography confirms narrowing of the portal vein and tips entry
site.  These areas were dilated to 10 mm with success.  Pre
revision gradient was 11 mmHg and post revision gradient was 8
mmHg.  Stenting was not performed at this time.

## 2013-08-17 NOTE — Progress Notes (Signed)
NA

## 2016-08-11 ENCOUNTER — Other Ambulatory Visit: Payer: Self-pay | Admitting: Internal Medicine

## 2016-08-11 DIAGNOSIS — K7469 Other cirrhosis of liver: Secondary | ICD-10-CM

## 2016-08-12 ENCOUNTER — Ambulatory Visit
Admission: RE | Admit: 2016-08-12 | Discharge: 2016-08-12 | Disposition: A | Discharge status: Home / Self Care | Payer: 59 | Source: Ambulatory Visit | Attending: Internal Medicine | Admitting: Internal Medicine

## 2016-08-12 DIAGNOSIS — K7469 Other cirrhosis of liver: Secondary | ICD-10-CM

## 2017-08-13 ENCOUNTER — Telehealth: Payer: Self-pay | Admitting: Gastroenterology

## 2017-08-13 ENCOUNTER — Other Ambulatory Visit: Payer: Self-pay

## 2017-08-13 DIAGNOSIS — I8500 Esophageal varices without bleeding: Secondary | ICD-10-CM

## 2017-08-13 NOTE — Telephone Encounter (Signed)
I spoke with Dr. Zollie Scale from [facility] hepatology. His TIPS is down and he has clot in the portal vein. IR at [facility] is going to attempt to open the TIPS and clear the clot.    Gordon Huerta, He needs EGD next Thursday (27th) with MAC at [facility] for variceal surveillance, per discussion with Dr. Zollie Scale will band any varices larger than small.  Thanks

## 2017-08-13 NOTE — Telephone Encounter (Signed)
EGD scheduled, pt instructed and medications reviewed.  Patient instructions mailed to home.  Patient to call with any questions or concerns.  

## 2017-08-14 ENCOUNTER — Other Ambulatory Visit: Payer: Self-pay

## 2017-08-14 ENCOUNTER — Encounter (HOSPITAL_COMMUNITY): Payer: Self-pay | Admitting: Emergency Medicine

## 2017-08-20 ENCOUNTER — Encounter (HOSPITAL_COMMUNITY): Payer: Self-pay | Admitting: Certified Registered Nurse Anesthetist

## 2017-08-20 ENCOUNTER — Encounter (HOSPITAL_COMMUNITY)
Admission: RE | Disposition: A | Discharge status: Home / Self Care | Payer: Self-pay | Source: Ambulatory Visit | Attending: Gastroenterology

## 2017-08-20 ENCOUNTER — Other Ambulatory Visit: Payer: Self-pay

## 2017-08-20 ENCOUNTER — Ambulatory Visit (HOSPITAL_COMMUNITY)
Admission: RE | Admit: 2017-08-20 | Discharge: 2017-08-20 | Disposition: A | Discharge status: Home / Self Care | Payer: 59 | Source: Ambulatory Visit | Attending: Gastroenterology | Admitting: Gastroenterology

## 2017-08-20 ENCOUNTER — Ambulatory Visit (HOSPITAL_COMMUNITY): Payer: 59 | Admitting: Certified Registered Nurse Anesthetist

## 2017-08-20 DIAGNOSIS — K7460 Unspecified cirrhosis of liver: Secondary | ICD-10-CM | POA: Diagnosis not present

## 2017-08-20 DIAGNOSIS — Z79899 Other long term (current) drug therapy: Secondary | ICD-10-CM | POA: Insufficient documentation

## 2017-08-20 DIAGNOSIS — I8500 Esophageal varices without bleeding: Secondary | ICD-10-CM | POA: Diagnosis not present

## 2017-08-20 DIAGNOSIS — B1910 Unspecified viral hepatitis B without hepatic coma: Secondary | ICD-10-CM | POA: Insufficient documentation

## 2017-08-20 DIAGNOSIS — K219 Gastro-esophageal reflux disease without esophagitis: Secondary | ICD-10-CM | POA: Insufficient documentation

## 2017-08-20 DIAGNOSIS — K766 Portal hypertension: Secondary | ICD-10-CM | POA: Diagnosis not present

## 2017-08-20 DIAGNOSIS — K3189 Other diseases of stomach and duodenum: Secondary | ICD-10-CM

## 2017-08-20 HISTORY — PX: ESOPHAGOGASTRODUODENOSCOPY (EGD) WITH PROPOFOL: SHX5813

## 2017-08-20 SURGERY — ESOPHAGOGASTRODUODENOSCOPY (EGD) WITH PROPOFOL
Anesthesia: Monitor Anesthesia Care

## 2017-08-20 MED ORDER — LACTATED RINGERS IV SOLN
INTRAVENOUS | Status: DC
  Administered 2017-08-20: 14:00:00 via INTRAVENOUS

## 2017-08-20 MED ORDER — PROPOFOL 10 MG/ML IV BOLUS
INTRAVENOUS | Status: AC
  Filled 2017-08-20: qty 60

## 2017-08-20 MED ORDER — LIDOCAINE 2% (20 MG/ML) 5 ML SYRINGE
INTRAMUSCULAR | Status: DC | PRN
  Administered 2017-08-20: 100 mg via INTRAVENOUS

## 2017-08-20 MED ORDER — PROPOFOL 10 MG/ML IV BOLUS
INTRAVENOUS | Status: DC | PRN
  Administered 2017-08-20 (×2): 20 mg via INTRAVENOUS
  Administered 2017-08-20: 30 mg via INTRAVENOUS
  Administered 2017-08-20 (×2): 20 mg via INTRAVENOUS
  Administered 2017-08-20: 30 mg via INTRAVENOUS

## 2017-08-20 MED ORDER — PROPOFOL 500 MG/50ML IV EMUL
INTRAVENOUS | Status: DC | PRN
  Administered 2017-08-20: 150 ug/kg/min via INTRAVENOUS

## 2017-08-20 MED ORDER — SODIUM CHLORIDE 0.9 % IV SOLN: INTRAVENOUS | Status: DC

## 2017-08-20 SURGICAL SUPPLY — 15 items

## 2017-08-20 NOTE — Anesthesia Preprocedure Evaluation (Addendum)
Anesthesia Evaluation  Patient identified by MRN, date of birth, ID band Patient awake    Reviewed: Allergy & Precautions, NPO status , Patient's Chart, lab work & pertinent test results  History of Anesthesia Complications Negative for: history of anesthetic complications  Airway Mallampati: I  TM Distance: >3 FB Neck ROM: Full    Dental  (+) Dental Advisory Given   Pulmonary neg pulmonary ROS,    breath sounds clear to auscultation       Cardiovascular negative cardio ROS   Rhythm:Regular Rate:Normal     Neuro/Psych negative neurological ROS     GI/Hepatic GERD  Controlled,(+) Cirrhosis   Esophageal Varices    , Hepatitis -  Endo/Other  negative endocrine ROS  Renal/GU negative Renal ROS     Musculoskeletal   Abdominal   Peds  Hematology negative hematology ROS (+)   Anesthesia Other Findings   Reproductive/Obstetrics                            Anesthesia Physical Anesthesia Plan  ASA: III  Anesthesia Plan: MAC   Post-op Pain Management:    Induction: Intravenous  PONV Risk Score and Plan: 1 and Treatment may vary due to age or medical condition  Airway Management Planned: Natural Airway and Nasal Cannula  Additional Equipment:   Intra-op Plan:   Post-operative Plan:   Informed Consent: I have reviewed the patients History and Physical, chart, labs and discussed the procedure including the risks, benefits and alternatives for the proposed anesthesia with the patient or authorized representative who has indicated his/her understanding and acceptance.   Dental advisory given  Plan Discussed with: CRNA and Surgeon  Anesthesia Plan Comments: (Plan routine monitors, MAC)        Anesthesia Quick Evaluation

## 2017-08-20 NOTE — Discharge Instructions (Signed)
YOU HAD AN ENDOSCOPIC PROCEDURE TODAY: Refer to the procedure report and other information in the discharge instructions given to you for any specific questions about what was found during the examination. If this information does not answer your questions, please call [place] office at [phone] to clarify.  ° °YOU SHOULD EXPECT: Some feelings of bloating in the abdomen. Passage of more gas than usual. Walking can help get rid of the air that was put into your GI tract during the procedure and reduce the bloating. If you had a lower endoscopy (such as a colonoscopy or flexible sigmoidoscopy) you may notice spotting of blood in your stool or on the toilet paper. Some abdominal soreness may be present for a day or two, also. ° °DIET: Your first meal following the procedure should be a light meal and then it is ok to progress to your normal diet. A half-sandwich or bowl of soup is an example of a good first meal. Heavy or fried foods are harder to digest and may make you feel nauseous or bloated. Drink plenty of fluids but you should avoid alcoholic beverages for 24 hours. If you had a esophageal dilation, please see attached instructions for diet.   ° °ACTIVITY: Your care partner should take you home directly after the procedure. You should plan to take it easy, moving slowly for the rest of the day. You can resume normal activity the day after the procedure however YOU SHOULD NOT DRIVE, use power tools, machinery or perform tasks that involve climbing or major physical exertion for 24 hours (because of the sedation medicines used during the test).  ° °SYMPTOMS TO REPORT IMMEDIATELY: °A gastroenterologist can be reached at any hour. Please call [phone]  for any of the following symptoms:  °Following lower endoscopy (colonoscopy, flexible sigmoidoscopy) °Excessive amounts of blood in the stool  °Significant tenderness, worsening of abdominal pains  °Swelling of the abdomen that is new, acute  °Fever of 100° or  higher  °Following upper endoscopy (EGD, EUS, ERCP, esophageal dilation) °Vomiting of blood or coffee ground material  °New, significant abdominal pain  °New, significant chest pain or pain under the shoulder blades  °Painful or persistently difficult swallowing  °New shortness of breath  °Black, tarry-looking or red, bloody stools ° °FOLLOW UP:  °If any biopsies were taken you will be contacted by phone or by letter within the next 1-3 weeks. Call [phone]  if you have not heard about the biopsies in 3 weeks.  °Please also call with any specific questions about appointments or follow up tests. ° °

## 2017-08-20 NOTE — Anesthesia Postprocedure Evaluation (Signed)
Anesthesia Post Note  Patient: Gordon Huerta  Procedure(s) Performed: ESOPHAGOGASTRODUODENOSCOPY (EGD) WITH PROPOFOL (N/A )     Patient location during evaluation: Endoscopy Anesthesia Type: MAC Level of consciousness: awake and alert, oriented and patient cooperative Pain management: pain level controlled Vital Signs Assessment: post-procedure vital signs reviewed and stable Respiratory status: spontaneous breathing, nonlabored ventilation and respiratory function stable Cardiovascular status: blood pressure returned to baseline and stable Postop Assessment: no headache and no apparent nausea or vomiting Anesthetic complications: no    Last Vitals:  Vitals:   08/20/17 1411 08/20/17 1423  BP: (!) 154/79 (!) 151/68  Pulse:    Resp: 18 18  Temp: 36.6 C   SpO2: 100% 100%    Last Pain:  Vitals:   08/20/17 1411  TempSrc: Oral                 Tien Spooner,E. Vladimir Lenhoff

## 2017-08-20 NOTE — Transfer of Care (Signed)
Immediate Anesthesia Transfer of Care Note  Patient: Gordon Huerta  Procedure(s) Performed: ESOPHAGOGASTRODUODENOSCOPY (EGD) WITH PROPOFOL (N/A )  Patient Location: Endoscopy Unit  Anesthesia Type:MAC  Level of Consciousness: drowsy and patient cooperative  Airway & Oxygen Therapy: Patient Spontanous Breathing and Patient connected to nasal cannula oxygen  Post-op Assessment: Report given to RN and Post -op Vital signs reviewed and stable  Post vital signs: Reviewed and stable  Last Vitals:  Vitals:   08/20/17 1319  BP: (!) 153/80  Pulse: (!) 52  Resp: 13  Temp: 36.7 C  SpO2: 100%    Last Pain:  Vitals:   08/20/17 1319  TempSrc: Oral         Complications: No apparent anesthesia complications

## 2017-08-20 NOTE — H&P (Signed)
  HPI: This is a very pleasant 51 yo man whom I last saw 5-6 years ago  He has chronic hep B related cirrhosis  S/p TIPS remotely. Currently lives in [place] but comes back to see his transplant hepatologist periodically. Dr. Hamilton CapriZamor called me 1-2 weeks ago; TIPS is down and he has PV clot.  Asked that I perform EGD and band any varices that are not small in size.  [facility] is working on revising the TIPS, will try to remove the PV clot as well.  ROS: complete GI ROS as described in HPI, all other review negative.  Constitutional:  No unintentional weight loss   Past Medical History:  Diagnosis Date  . Cirrhosis (HCC)   . Esophageal varices (HCC)   . Hepatitis   . Portal hypertension (HCC)     Past Surgical History:  Procedure Laterality Date  . HERNIA REPAIR    . TIPS PROCEDURE      No current facility-administered medications for this encounter.     Allergies as of 08/13/2017  . (No Known Allergies)    Family History  Problem Relation Age of Onset  . Liver cancer Mother   . Heart attack Father     Social History   Socioeconomic History  . Marital status: Married    Spouse name: Not on file  . Number of children: Not on file  . Years of education: Not on file  . Highest education level: Not on file  Social Needs  . Financial resource strain: Not on file  . Food insecurity - worry: Not on file  . Food insecurity - inability: Not on file  . Transportation needs - medical: Not on file  . Transportation needs - non-medical: Not on file  Occupational History  . Occupation: Secondary school teacherntrepreneur    Employer: SELF-EMPLOYED  Tobacco Use  . Smoking status: Never Smoker  . Smokeless tobacco: Never Used  Substance and Sexual Activity  . Alcohol use: No  . Drug use: No  . Sexual activity: Not on file  Other Topics Concern  . Not on file  Social History Narrative  . Not on file     Physical Exam: There were no vitals taken for this visit. Constitutional: generally  well-appearing Psychiatric: alert and oriented x3 Abdomen: soft, nontender, nondistended, no obvious ascites, no peritoneal signs, normal bowel sounds No peripheral edema noted in lower extremities  Assessment and plan: 51 y.o. male with hep B cirrhosis   For EGD today, surveillance of varices +/- banding.  Please see the "Patient Instructions" section for addition details about the plan.  Rob Buntinganiel Lucella Pommier, MD [facility] 08/20/2017, 12:58 PM

## 2017-08-20 NOTE — Op Note (Signed)
[facility] Patient Name: Gordon Huerta Procedure Date: 08/20/2017 MRN: [id number] Attending MD: Rachael Feeaniel P Jacobs , MD Date of Birth: 01/09/1966 CSN: [id number] Age: [age] Admit Type: Outpatient Procedure:                Upper GI endoscopy Indications:              Surveillance procedure, Follow-up of esophageal                            varices Providers:                Rachael Feeaniel P. Jacobs, MD, Dwain SarnaPatricia Ford, RN, Kandice RobinsonsGuillaume                            [place], Technician Referring MD:             Loura HaltPhillipe Zamor, [facility]. Medicines:                Monitored Anesthesia Care Complications:            No immediate complications. Estimated blood loss:                            None. Estimated Blood Loss:     Estimated blood loss: none. Procedure:                Pre-Anesthesia Assessment:                           - Prior to the procedure, a History and Physical                            was performed, and patient medications and                            allergies were reviewed. The patient's tolerance of                            previous anesthesia was also reviewed. The risks                            and benefits of the procedure and the sedation                            options and risks were discussed with the patient.                            All questions were answered, and informed consent                            was obtained. Prior Anticoagulants: The patient has                            taken no previous anticoagulant or antiplatelet                            agents. ASA  Grade Assessment: III - A patient with                            severe systemic disease. After reviewing the risks                            and benefits, the patient was deemed in                            satisfactory condition to undergo the procedure.                           After obtaining informed consent, the endoscope was                            passed  under direct vision. Throughout the                            procedure, the patient's blood pressure, pulse, and                            oxygen saturations were monitored continuously. The                            [id number]) scope was introduced through the                            mouth, and advanced to the second part of duodenum.                            The upper GI endoscopy was accomplished without                            difficulty. The patient tolerated the procedure                            well. Scope In: Scope Out: Findings:      There were two very short trunks of small esophageal varices, see images.      Moderate portal hypertensive gastropathy was found in the entire       examined stomach. There were no obvious gastric varices.      The examined duodenum was normal. Impression:               - Small GE junction varices (2 short trunks of                            small varices).                           - Portal gastropathy but no obvious gastric varices. Moderate Sedation:      N/A- Per Anesthesia Care Recommendation:           - Patient has a contact number available for  emergencies. The signs and symptoms of potential                            delayed complications were discussed with the                            patient. Return to normal activities tomorrow.                            Written discharge instructions were provided to the                            patient.                           - Resume previous diet.                           - Continue present medications.                           - Continue with plans for TIPS revision (or new                            TIPS) tomorrow at [facility]. Procedure Code(s):        --- Professional ---                           [phone], Esophagogastroduodenoscopy, flexible,                            transoral; diagnostic, including collection of                             specimen(s) by brushing or washing, when performed                            (separate procedure) Diagnosis Code(s):        --- Professional ---                           I85.00, Esophageal varices without bleeding                           K76.6, Portal hypertension                           K31.89, Other diseases of stomach and duodenum CPT copyright 2016 American Medical Association. All rights reserved. The codes documented in this report are preliminary and upon coder review may  be revised to meet current compliance requirements. Rachael Fee, MD 08/20/2017 2:07:32 PM This report has been signed electronically. Number of Addenda: 0

## 2017-08-21 ENCOUNTER — Encounter (HOSPITAL_COMMUNITY): Payer: Self-pay | Admitting: Gastroenterology

## 2019-05-26 ENCOUNTER — Other Ambulatory Visit: Payer: Self-pay

## 2019-05-26 DIAGNOSIS — Z20822 Contact with and (suspected) exposure to covid-19: Secondary | ICD-10-CM

## 2019-05-27 LAB — NOVEL CORONAVIRUS, NAA: SARS-CoV-2, NAA: NOT DETECTED

## 2020-06-17 ENCOUNTER — Encounter (HOSPITAL_COMMUNITY): Payer: Self-pay

## 2020-06-17 ENCOUNTER — Ambulatory Visit (HOSPITAL_COMMUNITY)
Admission: EM | Admit: 2020-06-17 | Discharge: 2020-06-17 | Disposition: A | Discharge status: Home / Self Care | Payer: 59 | Attending: Urgent Care | Admitting: Urgent Care

## 2020-06-17 ENCOUNTER — Other Ambulatory Visit: Payer: Self-pay

## 2020-06-17 DIAGNOSIS — R0789 Other chest pain: Secondary | ICD-10-CM

## 2020-06-17 DIAGNOSIS — R1084 Generalized abdominal pain: Secondary | ICD-10-CM

## 2020-06-17 DIAGNOSIS — K7460 Unspecified cirrhosis of liver: Secondary | ICD-10-CM

## 2020-06-17 DIAGNOSIS — K529 Noninfective gastroenteritis and colitis, unspecified: Secondary | ICD-10-CM

## 2020-06-17 MED ORDER — OXYCODONE HCL 5 MG PO TABS: 5.0000 mg | ORAL_TABLET | Freq: Three times a day (TID) | ORAL | 0 refills | Status: DC | PRN

## 2020-06-17 NOTE — ED Provider Notes (Signed)
[facility]   MRN: [id number] DOB: [birth year]  Subjective:   Gordon Huerta is a 54 y.o. male presenting for 1 day history of acute onset diffuse abdominal pain, lower chest pain.  Patient states that he feels a burning sensation through his midline.  Has a history of liver cirrhosis, had TIPS procedure.  He is supposed to be having a CT scan of the abdomen tomorrow for monitoring of his TIPS.  States that he had eaten out at a [place] steak house last night, also had gone to a party and ate a salad there.  Symptoms started thereafter.  Usually uses lactulose twice a week, last dose was last night.  Has had 5 bowel movements but denies bloody stools, diarrhea, nausea, vomiting, fever.  No current facility-administered medications for this encounter.  Current Outpatient Medications:  .  entecavir (BARACLUDE) 0.5 MG tablet, Take 0.5 mg by mouth at bedtime., Disp: , Rfl:  .  lactulose (CHRONULAC) 10 GM/15ML solution, Take 10 g by mouth daily as needed (for constipation). Patient to take 10 g/15 ml qd.  Dispense 2 bottles.  May refill x 2.  (total Rx for 3 mo)  Rx called to pharmacist at [facility] on [address]  Order per Dr Art Bonnielee Haff  , Disp: , Rfl:  .  Magnesium 250 MG TABS, Take 250 mg by mouth daily., Disp: , Rfl:  .  Multiple Vitamin (MULITIVITAMIN WITH MINERALS) TABS, Take 1 tablet by mouth daily., Disp: , Rfl:    No Known Allergies  Past Medical History:  Diagnosis Date  . Cirrhosis (HCC)   . Esophageal varices (HCC)   . Hepatitis   . Portal hypertension (HCC)      Past Surgical History:  Procedure Laterality Date  . ESOPHAGOGASTRODUODENOSCOPY (EGD) WITH PROPOFOL N/A 08/20/2017   Procedure: ESOPHAGOGASTRODUODENOSCOPY (EGD) WITH PROPOFOL;  Surgeon: Rachael Fee, MD;  Location: [facility] ENDOSCOPY;  Service: Endoscopy;  Laterality: N/A;  . HERNIA REPAIR    . TIPS PROCEDURE      Family History  Problem Relation Age of Onset  . Liver cancer  Mother   . Heart attack Father     Social History   Tobacco Use  . Smoking status: Never Smoker  . Smokeless tobacco: Never Used  Substance Use Topics  . Alcohol use: No  . Drug use: No    ROS   Objective:   Vitals: BP (!) 151/83 (BP Location: Right Arm)   Pulse 65   Temp 97.8 F (36.6 C) (Oral)   Resp 18   SpO2 100%   Physical Exam Constitutional:      General: He is not in acute distress.    Appearance: Normal appearance. He is well-developed. He is not ill-appearing, toxic-appearing or diaphoretic.  HENT:     Head: Normocephalic and atraumatic.     Right Ear: External ear normal.     Left Ear: External ear normal.     Nose: Nose normal.     Mouth/Throat:     Mouth: Mucous membranes are moist.     Pharynx: Oropharynx is clear.  Eyes:     General: No scleral icterus.    Extraocular Movements: Extraocular movements intact.     Pupils: Pupils are equal, round, and reactive to light.  Cardiovascular:     Rate and Rhythm: Normal rate and regular rhythm.     Heart sounds: Normal heart sounds. No murmur heard.  No friction rub. No gallop.   Pulmonary:  Effort: Pulmonary effort is normal. No respiratory distress.     Breath sounds: Normal breath sounds. No stridor. No wheezing, rhonchi or rales.  Abdominal:     General: Bowel sounds are increased. There is no distension.     Palpations: Abdomen is soft. There is no mass.     Tenderness: There is generalized abdominal tenderness. There is no right CVA tenderness, left CVA tenderness, guarding or rebound.  Skin:    General: Skin is warm and dry.  Neurological:     Mental Status: He is alert and oriented to person, place, and time.  Psychiatric:        Mood and Affect: Mood normal.        Behavior: Behavior normal.        Thought Content: Thought content normal.     ED ECG REPORT   Date: 06/17/2020  Rate: 52bpm  Rhythm: sinus bradycardia  QRS Axis: normal  Intervals: normal  ST/T Wave abnormalities:  normal  Conduction Disutrbances:none  Narrative Interpretation: Sinus bradycardia at 52 bpm.  No previous EKG for comparison.  No acute findings.  Old EKG Reviewed: none available  I have personally reviewed the EKG tracing and agree with the computerized printout as noted.   Assessment and Plan :   I have reviewed the PDMP during this encounter.  1. Colitis   2. Generalized abdominal pain   3. Atypical chest pain   4. Hepatic cirrhosis, unspecified hepatic cirrhosis type, unspecified whether ascites present [facility])     54 year old male with history of liver cirrhosis, s/p TIPS procedure presenting for 1 day history of acute onset generalized abdominal pain and lower chest pain.  Vital signs stable, EKG reassuring.  Abdominal exam with hyperactive bowel sounds and generalized tenderness without guarding or rebound tenderness.  Patient is supposed to have a CT scan tomorrow and therefore counseled that we could obtain a GI panel and do pain control until then.  Recommended he stop using lactulose, hydrate very well, eat light meals.  Maintain strict ER precautions. Oxycodone for pain control. Counseled patient on potential for adverse effects with medications prescribed today, patient verbalized understanding.    Wallis Bamberg, PA-C 06/17/20 1141

## 2020-06-17 NOTE — ED Triage Notes (Signed)
Pt C/O of abdominal pain and Heartburn. Pt states he ate at steak house last night and around 4 am. He wake up with his stomach and chest area burning. He then took some tums but symptoms got worst.

## 2020-06-17 NOTE — Discharge Instructions (Signed)
Make sure you push fluids drinking mostly water but mix it with Gatorade.  Try to eat light meals including soups, broths and soft foods, fruits.  Make sure you stop using lactulose as this can worsen some of your belly symptoms.  You can take oxycodone for your pain.  Bring back the stool sample as soon as you can so that we can perform testing.  Please report to the emergency room for a CT scan of the abdomen if your symptoms worsen or you start having severe abdominal pain not helped by taking the pain medication or start having bloody stools or blood in the vomit.

## 2020-06-17 NOTE — ED Notes (Signed)
Provided supplies for stool collection 

## 2020-06-17 NOTE — ED Notes (Signed)
Patient tried twice to obtain stool specimen.  Both time insufficient amounts.  Sent patient home with supplies to obtain stool

## 2020-06-19 ENCOUNTER — Other Ambulatory Visit (HOSPITAL_COMMUNITY): Payer: Self-pay | Admitting: Internal Medicine

## 2020-06-19 DIAGNOSIS — K7460 Unspecified cirrhosis of liver: Secondary | ICD-10-CM

## 2020-06-19 DIAGNOSIS — I81 Portal vein thrombosis: Secondary | ICD-10-CM

## 2020-06-19 DIAGNOSIS — T829XXA Unspecified complication of cardiac and vascular prosthetic device, implant and graft, initial encounter: Secondary | ICD-10-CM

## 2020-06-25 ENCOUNTER — Ambulatory Visit (HOSPITAL_COMMUNITY): Payer: 59

## 2020-09-05 ENCOUNTER — Ambulatory Visit (HOSPITAL_COMMUNITY): Payer: 59

## 2020-10-19 ENCOUNTER — Other Ambulatory Visit (HOSPITAL_COMMUNITY): Payer: Self-pay | Admitting: Internal Medicine

## 2020-10-19 DIAGNOSIS — K7460 Unspecified cirrhosis of liver: Secondary | ICD-10-CM

## 2020-10-19 DIAGNOSIS — T829XXA Unspecified complication of cardiac and vascular prosthetic device, implant and graft, initial encounter: Secondary | ICD-10-CM

## 2020-10-26 ENCOUNTER — Encounter (HOSPITAL_COMMUNITY): Payer: Self-pay

## 2020-10-26 ENCOUNTER — Ambulatory Visit (HOSPITAL_COMMUNITY): Payer: Self-pay

## 2020-11-20 ENCOUNTER — Ambulatory Visit (HOSPITAL_COMMUNITY): Payer: Self-pay

## 2021-03-12 ENCOUNTER — Other Ambulatory Visit (HOSPITAL_COMMUNITY): Payer: Self-pay | Admitting: Internal Medicine

## 2021-03-12 ENCOUNTER — Encounter (HOSPITAL_COMMUNITY): Payer: Self-pay

## 2021-03-12 ENCOUNTER — Ambulatory Visit (HOSPITAL_COMMUNITY)
Admission: RE | Admit: 2021-03-12 | Discharge: 2021-03-12 | Disposition: A | Discharge status: Home / Self Care | Payer: 59 | Source: Ambulatory Visit | Attending: Internal Medicine | Admitting: Internal Medicine

## 2021-03-12 ENCOUNTER — Other Ambulatory Visit: Payer: Self-pay

## 2021-03-12 DIAGNOSIS — K7460 Unspecified cirrhosis of liver: Secondary | ICD-10-CM | POA: Diagnosis not present

## 2021-03-12 DIAGNOSIS — T829XXA Unspecified complication of cardiac and vascular prosthetic device, implant and graft, initial encounter: Secondary | ICD-10-CM

## 2021-03-12 LAB — POCT I-STAT CREATININE: Creatinine, Ser: 0.9 mg/dL (ref 0.61–1.24)

## 2021-03-12 MED ORDER — IOHEXOL 350 MG/ML SOLN
80.0000 mL | Freq: Once | INTRAVENOUS | Status: AC | PRN
  Administered 2021-03-12: 80 mL via INTRAVENOUS

## 2021-04-10 ENCOUNTER — Telehealth: Payer: Self-pay | Admitting: Gastroenterology

## 2021-04-10 NOTE — Telephone Encounter (Signed)
Hey, he needs OV with myself or extender for cirrhosis, routine risk for CRC.  I was called by his transplant hepatologist.  He is at high risk for procedures and so needs OV before committing to egd, colonoscopy.  Thanks

## 2021-04-10 NOTE — Telephone Encounter (Signed)
The appt has been made for 06/18/21 at 1:50 pm with Dr Christella Hartigan.  Letter mailed to the pt with appt info.

## 2021-06-13 ENCOUNTER — Telehealth: Payer: Self-pay | Admitting: Gastroenterology

## 2021-06-14 NOTE — Telephone Encounter (Signed)
Tried to reach the pt and no answer and no voice mail.  Will call later

## 2021-06-17 NOTE — Telephone Encounter (Signed)
I have tried to reach the pt no answer and voice mail full.  Will await further communication from the pt.

## 2021-06-18 ENCOUNTER — Ambulatory Visit (INDEPENDENT_AMBULATORY_CARE_PROVIDER_SITE_OTHER): Payer: 59 | Admitting: Gastroenterology

## 2021-06-18 ENCOUNTER — Encounter: Payer: Self-pay | Admitting: Gastroenterology

## 2021-06-18 ENCOUNTER — Other Ambulatory Visit (INDEPENDENT_AMBULATORY_CARE_PROVIDER_SITE_OTHER): Payer: 59

## 2021-06-18 DIAGNOSIS — Z1211 Encounter for screening for malignant neoplasm of colon: Secondary | ICD-10-CM

## 2021-06-18 DIAGNOSIS — I8500 Esophageal varices without bleeding: Secondary | ICD-10-CM

## 2021-06-18 DIAGNOSIS — K7460 Unspecified cirrhosis of liver: Secondary | ICD-10-CM | POA: Diagnosis not present

## 2021-06-18 DIAGNOSIS — I8510 Secondary esophageal varices without bleeding: Secondary | ICD-10-CM | POA: Diagnosis not present

## 2021-06-18 LAB — PROTIME-INR
INR: 2 ratio — ABNORMAL HIGH (ref 0.8–1.0)
Prothrombin Time: 20.8 s — ABNORMAL HIGH (ref 9.6–13.1)

## 2021-06-18 LAB — COMPREHENSIVE METABOLIC PANEL
ALT: 36 U/L (ref 0–53)
AST: 68 U/L — ABNORMAL HIGH (ref 0–37)
Albumin: 3 g/dL — ABNORMAL LOW (ref 3.5–5.2)
Alkaline Phosphatase: 68 U/L (ref 39–117)
BUN: 11 mg/dL (ref 6–23)
CO2: 27 mEq/L (ref 19–32)
Calcium: 8.6 mg/dL (ref 8.4–10.5)
Chloride: 107 mEq/L (ref 96–112)
Creatinine, Ser: 0.9 mg/dL (ref 0.40–1.50)
GFR: 96.12 mL/min (ref >60.00)
Glucose, Bld: 107 mg/dL — ABNORMAL HIGH (ref 70–99)
Potassium: 3.8 mEq/L (ref 3.5–5.1)
Sodium: 138 mEq/L (ref 135–145)
Total Bilirubin: 2.6 mg/dL — ABNORMAL HIGH (ref 0.2–1.2)
Total Protein: 5.8 g/dL — ABNORMAL LOW (ref 6.0–8.3)

## 2021-06-18 LAB — CBC
HCT: 37.2 % — ABNORMAL LOW (ref 39.0–52.0)
Hemoglobin: 11.7 g/dL — ABNORMAL LOW (ref 13.0–17.0)
MCHC: 31.5 g/dL (ref 30.0–36.0)
MCV: 75.7 fl — ABNORMAL LOW (ref 78.0–100.0)
Platelets: 33 10*3/uL — CL (ref 150.0–400.0)
RBC: 4.91 Mil/uL (ref 4.22–5.81)
RDW: 17.5 % — ABNORMAL HIGH (ref 11.5–15.5)
WBC: 1.8 10*3/uL — CL (ref 4.0–10.5)

## 2021-06-18 MED ORDER — NA SULFATE-K SULFATE-MG SULF 17.5-3.13-1.6 GM/177ML PO SOLN: 1.0000 | ORAL | 0 refills | Status: AC

## 2021-06-18 NOTE — Patient Instructions (Addendum)
If you are age 55 or younger, your body mass index should be between 19-25. Your Body mass index is 36.78 kg/m. If this is out of the aformentioned range listed, please consider follow up with your Primary Care Provider.   ________________________________________________________  The [facility] providers would like to encourage you to use [facility] to communicate with providers for non-urgent requests or questions.  Due to long hold times on the telephone, sending your provider a message by [facility] may be a faster and more efficient way to get a response.  Please allow 48 business hours for a response.  Please remember that this is for non-urgent requests.  _______________________________________________________  Your provider has requested that you go to the basement level for lab work before leaving today. Press "B" on the elevator. The lab is located at the first door on the left as you exit the elevator.  You have been scheduled for an endoscopy and colonoscopy. Please follow the written instructions given to you at your visit today. Please pick up your prep supplies at the pharmacy within the next 1-3 days. If you use inhalers (even only as needed), please bring them with you on the day of your procedure.  Due to recent changes in healthcare laws, you may see the results of your imaging and laboratory studies on MyChart before your provider has had a chance to review them.  We understand that in some cases there may be results that are confusing or concerning to you. Not all laboratory results come back in the same time frame and the provider may be waiting for multiple results in order to interpret others.  Please give [place] 48 hours in order for your provider to thoroughly review all the results before contacting the office for clarification of your results.   You will be contacted by our office prior to your procedure for directions on holding your Xarelto.  If you do not hear from our office 1  week prior to your scheduled procedure, please call [phone] to discuss.   Thank you for entrusting me with your care and choosing [facility]  Dr Christella Hartigan

## 2021-06-18 NOTE — Progress Notes (Signed)
HPI: This is a very pleasant 55 year old man whom I last saw at the time of an EGD about 4-1/2 years ago.  He was last here in our office about 9 years ago.   He was last here in our office about 9 years ago.  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  He has had a TIPS placed for large esophageal varices and eventual TIPS revision as well.  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  He was getting his TIPS revised the following day at an outside facility.  I can see through epic care everywhere that he had an appointment with Dr. Vanessa Barbara about 2 months ago however I am unable to actually read the text from the report.  I see that he underwent CT scan angiogram through Dr. Trinidad Curet July 2022.  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  Splenorenal shunt was noted.  He is currently living in [place].  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  He and his wife might be moving again out of the country possibly to [place] possibly [place] in the next year or so.  He follows with Dr. Juluis Pitch at [facility].  He is not currently on the transplant list.  He sees his hepatologist about once a year and saw him just in the past 2 to 3 months.  He was recommended to have repeat EGD to check for varices again and also get up-to-date on colon cancer screening and that is why he is here.  He is on Eliquis for hopes that this will keep his TIPS patent, medicine as prescribed by his hematologist.   Review of systems: Pertinent positive and negative review of systems were noted in the above HPI section. All other review negative.   Past Medical History:  Diagnosis Date    Cirrhosis (HCC)    Esophageal varices (HCC)    Hepatitis    Portal hypertension (HCC)     Past Surgical History:  Procedure Laterality Date   ESOPHAGOGASTRODUODENOSCOPY (EGD) WITH PROPOFOL N/A 08/20/2017   Procedure: ESOPHAGOGASTRODUODENOSCOPY (EGD) WITH PROPOFOL;  Surgeon: Rachael Fee, MD;  Location: [facility];  Service: Endoscopy;  Laterality: N/A;   HERNIA REPAIR     TIPS PROCEDURE      Current Outpatient Medications  Medication Sig Dispense Refill   apixaban (ELIQUIS) 5 MG TABS tablet Take by mouth.     carvedilol (COREG) 6.25 MG tablet Take 6.25 mg by mouth 2 (two) times daily with a meal.     entecavir (BARACLUDE) 0.5 MG tablet Take 0.5 mg by mouth at bedtime.     lactulose (CHRONULAC) 10 GM/15ML solution Take 10 g by mouth daily as needed (for constipation). Patient to take 10 g/15 ml qd.  Dispense 2 bottles.  May refill x 2.  (total Rx for 3 mo)  Rx called to pharmacist at [facility] on [address]  Order per Dr Art Bonnielee Haff       Magnesium 250 MG TABS Take 250 mg by mouth daily.     Multiple Vitamin (MULITIVITAMIN WITH MINERALS) TABS Take 1 tablet by mouth daily.     No current facility-administered medications for this visit.  Allergies as of 06/18/2021   (No Known Allergies)    Family History  Problem Relation Age of Onset   Liver cancer Mother    Heart attack Father     Social History   Socioeconomic History   Marital status: Married    Spouse name: Not on file   Number of children: Not on file   Years of education: Not on file   Highest education level: Not on file  Occupational History   Occupation: Secondary school teacher: SELF-EMPLOYED  Tobacco Use   Smoking status: Never   Smokeless tobacco: Never  Substance and Sexual Activity   Alcohol use: No   Drug use: No   Sexual activity: Not on file  Other Topics Concern   Not on file  Social History Narrative   Not on file   Social Determinants of Health   Financial Resource  Strain: Not on file  Food Insecurity: Not on file  Transportation Needs: Not on file  Physical Activity: Not on file  Stress: Not on file  Social Connections: Not on file  Intimate Partner Violence: Not on file     Physical Exam: BP 138/76   Pulse (!) 53   Ht 6\' 3"  (1.905 m)   Wt 294 lb 4 oz (133.5 kg)   BMI 36.78 kg/m  Constitutional: generally well-appearing Psychiatric: alert and oriented x3 Eyes: extraocular movements intact Mouth: oral pharynx moist, no lesions Neck: supple no lymphadenopathy Cardiovascular: heart regular rate and rhythm Lungs: clear to auscultation bilaterally Abdomen: soft, nontender, nondistended, no obvious ascites, no peritoneal signs, normal bowel sounds Extremities: no lower extremity edema bilaterally Skin: no lesions on visible extremities   Assessment and plan: 55 y.o. male with chronic hepatitis B related cirrhosis, portal hypertension, routine risk for colon cancer  I am going to get records from his transplant hematologist sent here for review, I can see the records somewhat through epic but I would like to get complete records if possible.  It has been about 4 and half years since EGD and he has never had colon cancer screening.  His hepatologist was hoping that I could examine his esophagus again to see how his esophageal varices are.  He has had his TIPS revised at least once but I am not sure that it is functioning perfectly.  He is on Eliquis to help keep the TIPS patent and that will need to be held for 2 days.  We will make sure that Dr. 53 is okay with that recommendation.  At the same time as EGD then we will proceed with colonoscopy.  He will get a basic set of labs today as well including CBC, complete metabolic profile and coags.   Please see the "Patient Instructions" section for addition details about the plan.   Merri Ray, MD [facility] 06/18/2021, 1:56 PM  Cc: Zamor, Phillippe Jude, *  Total time on  date of encounter was 45 minutes (this included time spent preparing to see the patient reviewing records; obtaining and/or reviewing separately obtained history; performing a medically appropriate exam and/or evaluation; counseling and educating the patient and family if present; ordering medications, tests or procedures if applicable; and documenting clinical information in the health record).

## 2021-06-19 ENCOUNTER — Telehealth: Payer: Self-pay

## 2021-06-19 NOTE — Telephone Encounter (Signed)
   Gordon Huerta 25-Nov-1965 [id number]  Dear Dr Merri Ray:  We have scheduled the above named patient for a colonoscopy procedure. Our records show that he is on anticoagulation therapy.  Please advise as to whether the patient may come off their therapy of Eliquis 2 days prior to their procedure which is scheduled for 08-06-21.  Please route your response to Blondell Reveal, CMA or fax response to [phone].  Sincerely,    [facility]

## 2021-07-24 ENCOUNTER — Other Ambulatory Visit: Payer: Self-pay

## 2021-07-24 DIAGNOSIS — D696 Thrombocytopenia, unspecified: Secondary | ICD-10-CM

## 2021-07-24 MED ORDER — SODIUM CHLORIDE 0.9% IV SOLUTION: Freq: Once | INTRAVENOUS | Status: AC

## 2021-07-24 NOTE — Telephone Encounter (Signed)
Clearance letter was faxed to Dr Merri Ray on 06-19-21 and 07-05-21 to [phone]  I called to the Dr Ronney Lion office on 07-12-21 to confirm receipt of the clearance letter and was advised to fax to [phone].  Clearance letter was faxed to clinical location on 07-12-21 and 07-24-21.  I called to Dr Ronney Lion office on 07-24-21 and spoke with Marchelle Folks.  Marchelle Folks advised that patient had send their office a MyChart message with questions regarding Eliquis hold.  Patient was been given clearance to hold Eliquis by Dr Merri Ray by [facility] message.  I asked Marchelle Folks to please send documentation, so that we will have confirmation.  Marchelle Folks stated she will send a note to nurse to fax letter to our office.  Will continue to look for clearance letter.

## 2021-07-25 ENCOUNTER — Telehealth: Payer: Self-pay | Admitting: Gastroenterology

## 2021-07-25 ENCOUNTER — Other Ambulatory Visit: Payer: Self-pay

## 2021-07-25 ENCOUNTER — Encounter (HOSPITAL_COMMUNITY): Payer: Self-pay | Admitting: Gastroenterology

## 2021-07-25 DIAGNOSIS — D696 Thrombocytopenia, unspecified: Secondary | ICD-10-CM

## 2021-07-25 MED ORDER — SODIUM CHLORIDE 0.9% IV SOLUTION: Freq: Once | INTRAVENOUS | Status: AC

## 2021-07-25 NOTE — Telephone Encounter (Signed)
Do you have a phone number to return the call?

## 2021-07-25 NOTE — Telephone Encounter (Signed)
Patient advised that he has been given clearance to hold Eliquis 2 days prior to colonoscopy scheduled for 08-06-21.  Patient advised to take last dose of Eliquis on 08-03-21, and he will be advised when to restart Eliquis by Dr Christella Hartigan after the procedure.  Patient agreed to plan and verbalized understanding.  No further questions.

## 2021-07-25 NOTE — Telephone Encounter (Signed)
Sunita from the blood bank called about this patient'[s order.  She said it was sent to the wrong place, asked that you cancel that order, and send it to the right place.  She said this was the second one this week.  The request was for platelets and a tapin (sp??) screen.  Thank you.

## 2021-07-30 NOTE — Progress Notes (Signed)
Called and left message on phone for patient to arrive at [facility] for procedure and to have enough time to get his platelets before the procedure. Asked patient to call back to let us know. [phone]

## 2021-07-30 NOTE — Progress Notes (Signed)
Got in touch with patient regarding coming in at 0645 on 12/13 to get platlets. Patient stated he would get here at 0645.

## 2021-08-06 ENCOUNTER — Ambulatory Visit (HOSPITAL_COMMUNITY): Payer: Managed Care, Other (non HMO) | Admitting: Anesthesiology

## 2021-08-06 ENCOUNTER — Ambulatory Visit (HOSPITAL_COMMUNITY)
Admission: RE | Admit: 2021-08-06 | Discharge: 2021-08-06 | Disposition: A | Discharge status: Home / Self Care | Payer: Managed Care, Other (non HMO) | Attending: Gastroenterology | Admitting: Gastroenterology

## 2021-08-06 ENCOUNTER — Encounter (HOSPITAL_COMMUNITY): Payer: Self-pay | Admitting: Gastroenterology

## 2021-08-06 ENCOUNTER — Other Ambulatory Visit: Payer: Self-pay

## 2021-08-06 ENCOUNTER — Encounter (HOSPITAL_COMMUNITY)
Admission: RE | Disposition: A | Discharge status: Home / Self Care | Payer: Self-pay | Source: Home / Self Care | Attending: Gastroenterology

## 2021-08-06 DIAGNOSIS — K648 Other hemorrhoids: Secondary | ICD-10-CM | POA: Insufficient documentation

## 2021-08-06 DIAGNOSIS — I8500 Esophageal varices without bleeding: Secondary | ICD-10-CM

## 2021-08-06 DIAGNOSIS — K3189 Other diseases of stomach and duodenum: Secondary | ICD-10-CM

## 2021-08-06 DIAGNOSIS — K2970 Gastritis, unspecified, without bleeding: Secondary | ICD-10-CM | POA: Insufficient documentation

## 2021-08-06 DIAGNOSIS — K766 Portal hypertension: Secondary | ICD-10-CM

## 2021-08-06 DIAGNOSIS — Z1211 Encounter for screening for malignant neoplasm of colon: Secondary | ICD-10-CM | POA: Diagnosis present

## 2021-08-06 DIAGNOSIS — K7460 Unspecified cirrhosis of liver: Secondary | ICD-10-CM | POA: Diagnosis not present

## 2021-08-06 HISTORY — PX: ESOPHAGOGASTRODUODENOSCOPY (EGD) WITH PROPOFOL: SHX5813

## 2021-08-06 HISTORY — PX: COLONOSCOPY WITH PROPOFOL: SHX5780

## 2021-08-06 SURGERY — COLONOSCOPY WITH PROPOFOL
Anesthesia: Monitor Anesthesia Care

## 2021-08-06 MED ORDER — LACTATED RINGERS IV SOLN: INTRAVENOUS | Status: DC | PRN

## 2021-08-06 MED ORDER — PROPOFOL 10 MG/ML IV BOLUS
INTRAVENOUS | Status: DC | PRN
  Administered 2021-08-06 (×4): 50 mg via INTRAVENOUS
  Administered 2021-08-06: 100 mg via INTRAVENOUS

## 2021-08-06 MED ORDER — SODIUM CHLORIDE 0.9 % IV SOLN: INTRAVENOUS | Status: DC

## 2021-08-06 MED ORDER — GLYCOPYRROLATE 0.2 MG/ML IJ SOLN
INTRAMUSCULAR | Status: DC | PRN
  Administered 2021-08-06: .2 mg via INTRAVENOUS

## 2021-08-06 MED ORDER — LIDOCAINE 2% (20 MG/ML) 5 ML SYRINGE
INTRAMUSCULAR | Status: DC | PRN
  Administered 2021-08-06: 100 mg via INTRAVENOUS

## 2021-08-06 MED ORDER — PROPOFOL 500 MG/50ML IV EMUL
INTRAVENOUS | Status: DC | PRN
  Administered 2021-08-06: 150 ug/kg/min via INTRAVENOUS

## 2021-08-06 SURGICAL SUPPLY — 25 items

## 2021-08-06 NOTE — Anesthesia Preprocedure Evaluation (Addendum)
Anesthesia Evaluation  Patient identified by MRN, date of birth, ID band Patient awake    Reviewed: Allergy & Precautions, NPO status , Patient's Chart, lab work & pertinent test results, reviewed documented beta blocker date and time   Airway Mallampati: II  TM Distance: >3 FB Neck ROM: Full    Dental no notable dental hx. (+) Teeth Intact, Dental Advisory Given   Pulmonary neg pulmonary ROS,    Pulmonary exam normal breath sounds clear to auscultation       Cardiovascular negative cardio ROS Normal cardiovascular exam Rhythm:Regular Rate:Normal     Neuro/Psych negative neurological ROS  negative psych ROS   GI/Hepatic negative GI ROS, (+) Cirrhosis   Esophageal Varices    , Hepatitis -, BS/p TIPS   Endo/Other  negative endocrine ROS  Renal/GU negative Renal ROS  negative genitourinary   Musculoskeletal negative musculoskeletal ROS (+)   Abdominal   Peds  Hematology  (+) Blood dyscrasia (on eliquis), , plt 33, receiving 2 platelets per proceduralist   Anesthesia Other Findings   Reproductive/Obstetrics                           Anesthesia Physical Anesthesia Plan  ASA: 3  Anesthesia Plan: MAC   Post-op Pain Management:    Induction: Intravenous  PONV Risk Score and Plan: Propofol infusion and Treatment may vary due to age or medical condition  Airway Management Planned: Natural Airway  Additional Equipment:   Intra-op Plan:   Post-operative Plan:   Informed Consent: I have reviewed the patients History and Physical, chart, labs and discussed the procedure including the risks, benefits and alternatives for the proposed anesthesia with the patient or authorized representative who has indicated his/her understanding and acceptance.     Dental advisory given  Plan Discussed with: CRNA  Anesthesia Plan Comments:         Anesthesia Quick Evaluation

## 2021-08-06 NOTE — Op Note (Signed)
[facility] Patient Name: Gordon Huerta Procedure Date: 08/06/2021 MRN: [id number] Attending MD: Rachael Fee , MD Date of Birth: 08/05/1966 CSN: [id number] Age: 55 Admit Type: Outpatient Procedure:                Colonoscopy Indications:              Screening for colorectal malignant neoplasm Providers:                Rachael Fee, MD, Estella Husk RN, RN, Alan Ripper Technician, Technician Referring MD:              Medicines:                Monitored Anesthesia Care Complications:            No immediate complications. Estimated blood loss:                            None. Estimated Blood Loss:     Estimated blood loss: none. Procedure:                Pre-Anesthesia Assessment:                           - Prior to the procedure, a History and Physical                            was performed, and patient medications and                            allergies were reviewed. The patient's tolerance of                            previous anesthesia was also reviewed. The risks                            and benefits of the procedure and the sedation                            options and risks were discussed with the patient.                            All questions were answered, and informed consent                            was obtained. Prior Anticoagulants: The patient has                            taken Eliquis (apixaban), last dose was 2 days                            prior to procedure. ASA Grade Assessment: IV - A  patient with severe systemic disease that is a                            constant threat to life. After reviewing the risks                            and benefits, the patient was deemed in                            satisfactory condition to undergo the procedure.                           After obtaining informed consent, the colonoscope                            was passed under  direct vision. Throughout the                            procedure, the patient's blood pressure, pulse, and                            oxygen saturations were monitored continuously. The                            CF-HQ190L ([id number]) Olympus colonoscope was                            introduced through the anus and advanced to the the                            cecum, identified by appendiceal orifice and                            ileocecal valve. The colonoscopy was performed                            without difficulty. The patient tolerated the                            procedure well. The quality of the bowel                            preparation was good. The ileocecal valve,                            appendiceal orifice, and rectum were photographed. Scope In: 9:56:51 AM Scope Out: 10:11:05 AM Scope Withdrawal Time: 0 hours 8 minutes 32 seconds  Total Procedure Duration: 0 hours 14 minutes 14 seconds  Findings:      Internal hemorrhoids were found. The hemorrhoids were small.      The exam was otherwise without abnormality on direct and retroflexion       views. Impression:               - Internal hemorrhoids.                           -  The examination was otherwise normal on direct                            and retroflexion views.                           - No polyps or cancers. Moderate Sedation:      Not Applicable - Patient had care per Anesthesia. Recommendation:           - EGD now. Procedure Code(s):        --- Professional ---                           [phone], Colonoscopy, flexible; diagnostic, including                            collection of specimen(s) by brushing or washing,                            when performed (separate procedure) Diagnosis Code(s):        --- Professional ---                           Z12.11, Encounter for screening for malignant                            neoplasm of colon                           K64.8, Other hemorrhoids CPT  copyright 2019 American Medical Association. All rights reserved. The codes documented in this report are preliminary and upon coder review may  be revised to meet current compliance requirements. Rachael Fee, MD 08/06/2021 10:14:52 AM This report has been signed electronically. Number of Addenda: 0

## 2021-08-06 NOTE — Op Note (Signed)
[facility] Patient Name: Gordon Huerta Procedure Date: 08/06/2021 MRN: [id number] Attending MD: Rachael Fee , MD Date of Birth: 1966/04/23 CSN: [id number] Age: 55 Admit Type: Outpatient Procedure:                Upper GI endoscopy Indications:              Cirrhosis, screening for varices Providers:                Rachael Fee, MD, Estella Husk RN, RN, Alan Ripper Technician, Technician Referring MD:              Medicines:                Monitored Anesthesia Care Complications:            No immediate complications. Estimated blood loss:                            None. Estimated Blood Loss:     Estimated blood loss: none. Procedure:                Pre-Anesthesia Assessment:                           - Prior to the procedure, a History and Physical                            was performed, and patient medications and                            allergies were reviewed. The patient's tolerance of                            previous anesthesia was also reviewed. The risks                            and benefits of the procedure and the sedation                            options and risks were discussed with the patient.                            All questions were answered, and informed consent                            was obtained. Prior Anticoagulants: The patient has                            taken Eliquis (apixaban), last dose was 2 days                            prior to procedure. ASA Grade Assessment: IV - A  patient with severe systemic disease that is a                            constant threat to life. After reviewing the risks                            and benefits, the patient was deemed in                            satisfactory condition to undergo the procedure.                           After obtaining informed consent, the endoscope was                            passed under  direct vision. Throughout the                            procedure, the patient's blood pressure, pulse, and                            oxygen saturations were monitored continuously. The                            [id number]-[id number] ([id number]) Olympus endoscope was introduced                            through the mouth, and advanced to the second part                            of duodenum. The upper GI endoscopy was                            accomplished without difficulty. The patient                            tolerated the procedure well. Scope In: Scope Out: Findings:      Changes of mild portal gastropathy, mainly in distal stomach      The exam was otherwise without abnormality. No gastric or esophageal       varices. Impression:               - Changes of mild portal gastropathy, mainly in                            distal stomach                           - The examination was otherwise normal. No gastric                            or esophageal varices. Moderate Sedation:      Not Applicable - Patient had care per Anesthesia. Recommendation:           - Patient has a contact number available  for                            emergencies. The signs and symptoms of potential                            delayed complications were discussed with the                            patient. Return to normal activities tomorrow.                            Written discharge instructions were provided to the                            patient.                           - Resume previous diet.                           - Continue present medications. Procedure Code(s):        --- Professional ---                           [phone], Esophagogastroduodenoscopy, flexible,                            transoral; diagnostic, including collection of                            specimen(s) by brushing or washing, when performed                            (separate procedure) Diagnosis Code(s):        ---  Professional ---                           K29.70, Gastritis, unspecified, without bleeding                           K74.60, Unspecified cirrhosis of liver CPT copyright 2019 American Medical Association. All rights reserved. The codes documented in this report are preliminary and upon coder review may  be revised to meet current compliance requirements. Rachael Fee, MD 08/06/2021 10:25:36 AM This report has been signed electronically. Number of Addenda: 0

## 2021-08-06 NOTE — Transfer of Care (Signed)
Immediate Anesthesia Transfer of Care Note  Patient: Gordon Huerta  Procedure(s) Performed: COLONOSCOPY WITH PROPOFOL ESOPHAGOGASTRODUODENOSCOPY (EGD) WITH PROPOFOL  Patient Location: Endoscopy Unit  Anesthesia Type:General  Level of Consciousness: drowsy  Airway & Oxygen Therapy: Patient Spontanous Breathing and Patient connected to face mask oxygen  Post-op Assessment: Report given to RN and Post -op Vital signs reviewed and stable  Post vital signs: Reviewed and stable  Last Vitals:  Vitals Value Taken Time  BP 136/72 08/06/21 1030  Temp    Pulse 74 08/06/21 1031  Resp 19 08/06/21 1031  SpO2 100 % 08/06/21 1031  Vitals shown include unvalidated device data.  Last Pain:  Vitals:   08/06/21 1030  TempSrc:   PainSc: 0-No pain         Complications: No notable events documented.

## 2021-08-06 NOTE — H&P (Signed)
  HPI: This is a man with cirrhosis, routine risk for CRC   ROS: complete GI ROS as described in HPI, all other review negative.  Constitutional:  No unintentional weight loss   Past Medical History:  Diagnosis Date   Cirrhosis (HCC)    Esophageal varices (HCC)    Hepatitis    Portal hypertension (HCC)     Past Surgical History:  Procedure Laterality Date   ESOPHAGOGASTRODUODENOSCOPY (EGD) WITH PROPOFOL N/A 08/20/2017   Procedure: ESOPHAGOGASTRODUODENOSCOPY (EGD) WITH PROPOFOL;  Surgeon: Rachael Fee, MD;  Location: [facility];  Service: Endoscopy;  Laterality: N/A;   HERNIA REPAIR     TIPS PROCEDURE      Current Facility-Administered Medications  Medication Dose Route Frequency Provider Last Rate Last Admin   0.9 %  sodium chloride infusion   Intravenous Continuous Rachael Fee, MD        Allergies as of 06/18/2021   (No Known Allergies)    Family History  Problem Relation Age of Onset   Liver cancer Mother    Heart attack Father     Social History   Socioeconomic History   Marital status: Married    Spouse name: Not on file   Number of children: Not on file   Years of education: Not on file   Highest education level: Not on file  Occupational History   Occupation: Secondary school teacher: SELF-EMPLOYED  Tobacco Use   Smoking status: Never   Smokeless tobacco: Never  Substance and Sexual Activity   Alcohol use: No   Drug use: No   Sexual activity: Not on file  Other Topics Concern   Not on file  Social History Narrative   Not on file   Social Determinants of Health   Financial Resource Strain: Not on file  Food Insecurity: Not on file  Transportation Needs: Not on file  Physical Activity: Not on file  Stress: Not on file  Social Connections: Not on file  Intimate Partner Violence: Not on file     Physical Exam:  Constitutional: generally well-appearing Psychiatric: alert and oriented x3 Abdomen: soft, nontender, nondistended,  no obvious ascites, no peritoneal signs, normal bowel sounds No peripheral edema noted in lower extremities  Assessment and plan: 55 y.o. male with cirrhosis, routine risk for CRC  Colonoscopy and EGD today  Please see the "Patient Instructions" section for addition details about the plan.  Rob Bunting, MD [facility] 08/06/2021, 7:19 AM

## 2021-08-06 NOTE — Anesthesia Postprocedure Evaluation (Signed)
Anesthesia Post Note  Patient: Gordon Huerta  Procedure(s) Performed: COLONOSCOPY WITH PROPOFOL ESOPHAGOGASTRODUODENOSCOPY (EGD) WITH PROPOFOL     Patient location during evaluation: Endoscopy Anesthesia Type: MAC Level of consciousness: awake and alert Pain management: pain level controlled Vital Signs Assessment: post-procedure vital signs reviewed and stable Respiratory status: spontaneous breathing, nonlabored ventilation, respiratory function stable and patient connected to nasal cannula oxygen Cardiovascular status: blood pressure returned to baseline and stable Postop Assessment: no apparent nausea or vomiting Anesthetic complications: no   No notable events documented.  Last Vitals:  Vitals:   08/06/21 1040 08/06/21 1051  BP: (!) 154/81 (!) 158/89  Pulse: 78 67  Resp: 14 16  Temp: 36.9 C   SpO2: 100% 100%    Last Pain:  Vitals:   08/06/21 1051  TempSrc:   PainSc: 0-No pain                 [name] [name]

## 2021-08-07 ENCOUNTER — Encounter (HOSPITAL_COMMUNITY): Payer: Self-pay | Admitting: Gastroenterology

## 2021-08-07 LAB — PREPARE PLATELET PHERESIS
Unit division: 0
Unit division: 0

## 2021-08-07 LAB — BPAM PLATELET PHERESIS
Blood Product Expiration Date: 202212142359
Blood Product Expiration Date: 202212152359
ISSUE DATE / TIME: 202212130734
ISSUE DATE / TIME: 202212130840
Unit Type and Rh: 5100
Unit Type and Rh: 5100

## 2021-10-15 ENCOUNTER — Other Ambulatory Visit: Payer: Self-pay | Admitting: Internal Medicine

## 2021-10-15 DIAGNOSIS — I81 Portal vein thrombosis: Secondary | ICD-10-CM

## 2021-10-15 DIAGNOSIS — K766 Portal hypertension: Secondary | ICD-10-CM

## 2021-10-15 DIAGNOSIS — K7030 Alcoholic cirrhosis of liver without ascites: Secondary | ICD-10-CM

## 2021-10-22 ENCOUNTER — Other Ambulatory Visit: Payer: Managed Care, Other (non HMO)

## 2021-10-28 ENCOUNTER — Other Ambulatory Visit: Payer: Managed Care, Other (non HMO)

## 2021-10-31 ENCOUNTER — Ambulatory Visit
Admission: RE | Admit: 2021-10-31 | Discharge: 2021-10-31 | Disposition: A | Discharge status: Home / Self Care | Payer: Managed Care, Other (non HMO) | Source: Ambulatory Visit | Attending: Internal Medicine | Admitting: Internal Medicine

## 2021-10-31 ENCOUNTER — Other Ambulatory Visit: Payer: Self-pay

## 2021-10-31 DIAGNOSIS — K7030 Alcoholic cirrhosis of liver without ascites: Secondary | ICD-10-CM

## 2021-10-31 DIAGNOSIS — I81 Portal vein thrombosis: Secondary | ICD-10-CM

## 2021-10-31 DIAGNOSIS — K766 Portal hypertension: Secondary | ICD-10-CM

## 2021-10-31 MED ORDER — IOPAMIDOL (ISOVUE-300) INJECTION 61%
75.0000 mL | Freq: Once | INTRAVENOUS | Status: AC | PRN
  Administered 2021-10-31: 100 mL via INTRAVENOUS

## 2022-10-27 ENCOUNTER — Other Ambulatory Visit: Payer: Self-pay | Admitting: Nephrology

## 2022-10-27 DIAGNOSIS — R3129 Other microscopic hematuria: Secondary | ICD-10-CM

## 2022-10-27 DIAGNOSIS — R809 Proteinuria, unspecified: Secondary | ICD-10-CM

## 2022-11-06 ENCOUNTER — Ambulatory Visit
Admission: RE | Admit: 2022-11-06 | Discharge: 2022-11-06 | Disposition: A | Discharge status: Home / Self Care | Payer: Managed Care, Other (non HMO) | Source: Ambulatory Visit | Attending: Nephrology | Admitting: Nephrology

## 2022-11-06 DIAGNOSIS — R3129 Other microscopic hematuria: Secondary | ICD-10-CM

## 2022-11-06 DIAGNOSIS — R809 Proteinuria, unspecified: Secondary | ICD-10-CM

## 2022-11-12 ENCOUNTER — Other Ambulatory Visit: Payer: Managed Care, Other (non HMO)

## 2022-11-12 ENCOUNTER — Telehealth: Payer: Self-pay | Admitting: Hematology and Oncology

## 2022-11-12 NOTE — Telephone Encounter (Signed)
scheduled per 3/20 referral, pt has been called and confirmed date and time. Pt is aware of location and to arrive early for check in

## 2022-11-25 ENCOUNTER — Telehealth: Payer: Self-pay | Admitting: Hematology and Oncology

## 2022-11-25 NOTE — Telephone Encounter (Signed)
Patient called to cancel 4/3 appointments. Patient felt that he does not need this appointment right now. Informed patient that he can call back if he decides to reschedule.

## 2022-11-26 ENCOUNTER — Inpatient Hospital Stay: Payer: Managed Care, Other (non HMO) | Admitting: Hematology and Oncology

## 2022-11-26 ENCOUNTER — Inpatient Hospital Stay: Payer: Managed Care, Other (non HMO)

## 2022-11-28 ENCOUNTER — Encounter: Payer: Managed Care, Other (non HMO) | Admitting: Oncology

## 2022-12-04 ENCOUNTER — Telehealth: Payer: Self-pay | Admitting: Internal Medicine

## 2022-12-04 NOTE — Telephone Encounter (Signed)
I left Erie a detailed message on his only # to please call me back to see if we can add him on for 12/05/2022 at 11:30AM per Dr Leone Payor.

## 2022-12-04 NOTE — Telephone Encounter (Signed)
Former [employer] patient, [facility] sent request to perform EGD for variceal screening  Patient is on Eliquis  He needs an office visit with me to review since he is on Eliquis.  I am willing to see him at 1130 tomorrow if he could make that, this is not really urgent, however it could be scheduled next available also

## 2022-12-10 NOTE — Telephone Encounter (Signed)
Left message for patient to return my call.

## 2022-12-16 NOTE — Telephone Encounter (Signed)
We can stop calling

## 2023-07-09 ENCOUNTER — Encounter: Payer: Self-pay | Admitting: Nurse Practitioner

## 2023-07-09 ENCOUNTER — Other Ambulatory Visit: Payer: Self-pay | Admitting: Internal Medicine

## 2023-07-09 DIAGNOSIS — K7460 Unspecified cirrhosis of liver: Secondary | ICD-10-CM

## 2023-07-14 ENCOUNTER — Other Ambulatory Visit: Payer: Managed Care, Other (non HMO)

## 2023-07-15 ENCOUNTER — Ambulatory Visit
Admission: RE | Admit: 2023-07-15 | Discharge: 2023-07-15 | Disposition: A | Discharge status: Home / Self Care | Payer: Managed Care, Other (non HMO) | Source: Ambulatory Visit | Attending: Internal Medicine | Admitting: Internal Medicine

## 2023-07-15 DIAGNOSIS — K7460 Unspecified cirrhosis of liver: Secondary | ICD-10-CM

## 2023-07-15 MED ORDER — IOPAMIDOL (ISOVUE-370) INJECTION 76%
100.0000 mL | Freq: Once | INTRAVENOUS | Status: AC | PRN
  Administered 2023-07-15: 100 mL via INTRAVENOUS

## 2024-01-06 ENCOUNTER — Encounter: Payer: Self-pay | Admitting: Nurse Practitioner

## 2024-01-06 ENCOUNTER — Other Ambulatory Visit: Payer: Self-pay | Admitting: Nurse Practitioner

## 2024-01-06 DIAGNOSIS — R188 Other ascites: Secondary | ICD-10-CM

## 2024-01-07 ENCOUNTER — Ambulatory Visit
Admission: RE | Admit: 2024-01-07 | Discharge: 2024-01-07 | Disposition: A | Discharge status: Home / Self Care | Source: Ambulatory Visit | Attending: Nurse Practitioner | Admitting: Nurse Practitioner

## 2024-01-07 DIAGNOSIS — R188 Other ascites: Secondary | ICD-10-CM

## 2024-06-09 ENCOUNTER — Encounter: Payer: Self-pay | Admitting: Internal Medicine

## 2024-06-09 ENCOUNTER — Other Ambulatory Visit: Payer: Self-pay | Admitting: Internal Medicine

## 2024-06-09 DIAGNOSIS — K7460 Unspecified cirrhosis of liver: Secondary | ICD-10-CM

## 2024-06-09 DIAGNOSIS — Z95828 Presence of other vascular implants and grafts: Secondary | ICD-10-CM

## 2024-06-09 DIAGNOSIS — B181 Chronic viral hepatitis B without delta-agent: Secondary | ICD-10-CM

## 2024-06-09 DIAGNOSIS — K766 Portal hypertension: Secondary | ICD-10-CM

## 2024-06-22 ENCOUNTER — Other Ambulatory Visit

## 2024-07-13 ENCOUNTER — Inpatient Hospital Stay
Admission: RE | Admit: 2024-07-13 | Discharge: 2024-07-13 | Disposition: A | Source: Ambulatory Visit | Attending: Internal Medicine | Admitting: Internal Medicine

## 2024-07-13 DIAGNOSIS — Z95828 Presence of other vascular implants and grafts: Secondary | ICD-10-CM

## 2024-07-13 DIAGNOSIS — K7460 Unspecified cirrhosis of liver: Secondary | ICD-10-CM

## 2024-07-13 DIAGNOSIS — B181 Chronic viral hepatitis B without delta-agent: Secondary | ICD-10-CM

## 2024-07-13 DIAGNOSIS — K766 Portal hypertension: Secondary | ICD-10-CM

## 2024-07-13 MED ORDER — IOPAMIDOL (ISOVUE-300) INJECTION 61%
100.0000 mL | Freq: Once | INTRAVENOUS | Status: AC | PRN
  Administered 2024-07-13: 100 mL via INTRAVENOUS
